# Patient Record
Sex: Female | Born: 1990 | Race: White | Hispanic: No | Marital: Married | Smoking: Never smoker
Health system: Southern US, Community
[De-identification: ages and names within clinical notes are randomized; demographics above are authoritative.]

## PROBLEM LIST (undated history)

## (undated) DIAGNOSIS — G43909 Migraine, unspecified, not intractable, without status migrainosus: Secondary | ICD-10-CM

## (undated) DIAGNOSIS — Z8659 Personal history of other mental and behavioral disorders: Secondary | ICD-10-CM

## (undated) DIAGNOSIS — Z8619 Personal history of other infectious and parasitic diseases: Secondary | ICD-10-CM

## (undated) DIAGNOSIS — D352 Benign neoplasm of pituitary gland: Secondary | ICD-10-CM

## (undated) DIAGNOSIS — R011 Cardiac murmur, unspecified: Secondary | ICD-10-CM

## (undated) HISTORY — DX: Personal history of other infectious and parasitic diseases: Z86.19

## (undated) HISTORY — DX: Personal history of other mental and behavioral disorders: Z86.59

## (undated) HISTORY — DX: Benign neoplasm of pituitary gland: D35.2

## (undated) HISTORY — DX: Migraine, unspecified, not intractable, without status migrainosus: G43.909

## (undated) HISTORY — DX: Cardiac murmur, unspecified: R01.1

---
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
## 2017-08-15 HISTORY — PX: KNEE ARTHROSCOPY: SUR90

## 2018-04-06 DIAGNOSIS — D352 Benign neoplasm of pituitary gland: Secondary | ICD-10-CM | POA: Insufficient documentation

## 2018-04-06 DIAGNOSIS — R011 Cardiac murmur, unspecified: Secondary | ICD-10-CM | POA: Insufficient documentation

## 2019-02-22 ENCOUNTER — Other Ambulatory Visit: Payer: Self-pay

## 2019-02-22 ENCOUNTER — Ambulatory Visit: Payer: Managed Care, Other (non HMO) | Admitting: Family Medicine

## 2019-02-22 ENCOUNTER — Encounter: Payer: Self-pay | Admitting: Family Medicine

## 2019-02-22 VITALS — BP 98/70 | HR 67 | Temp 98.7°F | Resp 14 | Ht 63.5 in | Wt 167.2 lb

## 2019-02-22 DIAGNOSIS — Z7689 Persons encountering health services in other specified circumstances: Secondary | ICD-10-CM

## 2019-02-22 DIAGNOSIS — D352 Benign neoplasm of pituitary gland: Secondary | ICD-10-CM

## 2019-02-22 DIAGNOSIS — E663 Overweight: Secondary | ICD-10-CM

## 2019-02-22 DIAGNOSIS — R079 Chest pain, unspecified: Secondary | ICD-10-CM | POA: Diagnosis not present

## 2019-02-22 LAB — COMPREHENSIVE METABOLIC PANEL
ALT: 32 U/L (ref 0–35)
AST: 22 U/L (ref 0–37)
Albumin: 4.3 g/dL (ref 3.5–5.2)
Alkaline Phosphatase: 70 U/L (ref 39–117)
BUN: 8 mg/dL (ref 6–23)
CO2: 27 mEq/L (ref 19–32)
Calcium: 8.8 mg/dL (ref 8.4–10.5)
Chloride: 105 mEq/L (ref 96–112)
Creatinine, Ser: 0.7 mg/dL (ref 0.40–1.20)
GFR: 100.33 mL/min (ref >60.00)
Glucose, Bld: 80 mg/dL (ref 70–99)
Potassium: 4.1 mEq/L (ref 3.5–5.1)
Sodium: 138 mEq/L (ref 135–145)
Total Bilirubin: 0.9 mg/dL (ref 0.2–1.2)
Total Protein: 7.3 g/dL (ref 6.0–8.3)

## 2019-02-22 LAB — HEMOGLOBIN A1C: Hgb A1c MFr Bld: 5.4 % (ref 4.6–6.5)

## 2019-02-22 LAB — CBC WITH DIFFERENTIAL/PLATELET
Basophils Absolute: 0 10*3/uL (ref 0.0–0.1)
Basophils Relative: 0.5 % (ref 0.0–3.0)
Eosinophils Absolute: 0 10*3/uL (ref 0.0–0.7)
Eosinophils Relative: 0.8 % (ref 0.0–5.0)
HCT: 39 % (ref 36.0–46.0)
Hemoglobin: 13.1 g/dL (ref 12.0–15.0)
Lymphocytes Relative: 32.6 % (ref 12.0–46.0)
Lymphs Abs: 1.6 10*3/uL (ref 0.7–4.0)
MCHC: 33.7 g/dL (ref 30.0–36.0)
MCV: 94.4 fl (ref 78.0–100.0)
Monocytes Absolute: 0.5 10*3/uL (ref 0.1–1.0)
Monocytes Relative: 9.4 % (ref 3.0–12.0)
Neutro Abs: 2.7 10*3/uL (ref 1.4–7.7)
Neutrophils Relative %: 56.7 % (ref 43.0–77.0)
Platelets: 195 10*3/uL (ref 150.0–400.0)
RBC: 4.13 Mil/uL (ref 3.87–5.11)
RDW: 12.8 % (ref 11.5–15.5)
WBC: 4.8 10*3/uL (ref 4.0–10.5)

## 2019-02-22 LAB — LIPID PANEL
Cholesterol: 154 mg/dL (ref 0–200)
HDL: 40.8 mg/dL (ref >39.00)
LDL Cholesterol: 101 mg/dL — ABNORMAL HIGH (ref 0–99)
NonHDL: 113.08
Total CHOL/HDL Ratio: 4
Triglycerides: 58 mg/dL (ref 0.0–149.0)
VLDL: 11.6 mg/dL (ref 0.0–40.0)

## 2019-02-22 NOTE — Progress Notes (Signed)
   Subjective:    Patient ID: Cynthia Spears, female    DOB: [birth year], 28 y.o.   MRN: [id number]  HPI This is a 28 yo female who presents today to establish care. She is married and recently moved to the area. Enjoys hiking, running, outdoor activities, crocheting. She works at the [employer]. Loves her job. Stress level is low.   Last CPE- Pap- within the last 3 years. Had Mirena placed 3 years ago.  Tdap- unsure Flu- annual Eye- this year Dental- regular Exercise- regular  Prolactinoma diagnosed in 2014. Saw endocrine, requests referral. Continues on cabergoline. Headaches frequently. Last MRI 2018 (approximately). Last endocrine visit 10/2018. Not due for labs until 04/2019.   Mirena- 2019, doing well.   Chest pain for several years. Is having at varying times of day. No known triggers. Has a log of 4 episodes in last 2 weeks. Had one severe episode. Pain is alternately sharp, achy, sometimes radiates to upper abdomen. Lasts from few minutes to all day. No nausea or vomiting, no palpitations, no diaphoresis, no lower extremity edema. Family history of heart disease- father, both grandfathers with MI.   Past Medical History:  Diagnosis Date  . Heart murmur   . History of anorexia nervosa   . History of chicken pox   . Migraine   . Prolactinoma [facility])    Past Surgical History:  Procedure Laterality Date  . APPENDECTOMY  2008  . KNEE ARTHROSCOPY Right 2019  . SHOULDER ARTHROSCOPY Right 2019   Family History  Problem Relation Age of Onset  . Hyperlipidemia Mother   . Irritable bowel syndrome Mother   . Hypothyroidism Mother   . Learning disabilities Mother   . Hypertension Father   . Hyperlipidemia Father   . Arthritis Father    Social History   Tobacco Use  . Smoking status: Never Smoker  . Smokeless tobacco: Never Used  Substance Use Topics  . Alcohol use: Yes    Comment: once every other month  . Drug use: Never      Review of Systems Per HPI     Objective:   Physical Exam Physical Exam  Vitals reviewed. Constitutional: Oriented to person, place, and time. Appears well-developed and well-nourished.  HENT:  Head: Normocephalic and atraumatic.  Eyes: Conjunctivae are normal.  Neck: Normal range of motion. Neck supple.  Cardiovascular: Normal rate.   Pulmonary/Chest: Effort normal.  Neurological: Alert and oriented to person, place, and time.  Psychiatric: Normal mood and affect. Behavior is normal. Judgment and thought content normal.     BP 98/70   Pulse 67   Temp 98.7 F (37.1 C)   Resp 14   Ht 5' 3.5" (1.613 m)   Wt 167 lb 4 oz (75.9 kg)   BMI 29.16 kg/m       Assessment & Plan:  1. Encounter to establish care - will request records from previous gyn/endocrinologist  2. Chest pain, unspecified type - intermittent in nature, does not sound like typical angina, advised her to go to UC or ER if persistent episode lasting more than 15 minutes - CBC with Differential - Comprehensive metabolic panel  3. Overweight (BMI 25.0-29.9) - Lipid Panel - Hemoglobin A1c  4. Prolactinoma [facility]) - Records requested from prior endocrinologist - Ambulatory referral to Endocrinology   Clarene Reamer, FNP-BC  [facility]  02/22/2019 10:23 AM

## 2019-02-22 NOTE — Patient Instructions (Signed)
Good to see you today  I have put in a referral to endocrine  I will send you a message about your labs  If you develop chest pain that lasts more than 15 minutes, call the office if it is during the week or go to Urgent Care or the ER

## 2019-02-24 ENCOUNTER — Encounter: Payer: Self-pay | Admitting: Family Medicine

## 2019-02-25 NOTE — Telephone Encounter (Signed)
Cynthia Spears, I will work on getting records from those offices.

## 2019-03-01 NOTE — Telephone Encounter (Signed)
Pt called back with her maiden name. It is Cynthia Spears.

## 2019-03-04 ENCOUNTER — Encounter: Payer: Self-pay | Admitting: Emergency Medicine

## 2019-03-04 ENCOUNTER — Other Ambulatory Visit: Payer: Self-pay

## 2019-03-04 ENCOUNTER — Emergency Department
Admission: EM | Admit: 2019-03-04 | Discharge: 2019-03-05 | Disposition: A | Discharge status: Home / Self Care | Payer: Managed Care, Other (non HMO) | Attending: Emergency Medicine | Admitting: Emergency Medicine

## 2019-03-04 DIAGNOSIS — R079 Chest pain, unspecified: Secondary | ICD-10-CM | POA: Diagnosis present

## 2019-03-04 DIAGNOSIS — Z79899 Other long term (current) drug therapy: Secondary | ICD-10-CM | POA: Diagnosis not present

## 2019-03-04 LAB — BASIC METABOLIC PANEL
Anion gap: 8 (ref 5–15)
BUN: 10 mg/dL (ref 6–20)
CO2: 25 mmol/L (ref 22–32)
Calcium: 8.7 mg/dL — ABNORMAL LOW (ref 8.9–10.3)
Chloride: 104 mmol/L (ref 98–111)
Creatinine, Ser: 0.6 mg/dL (ref 0.44–1.00)
GFR calc Af Amer: >60 mL/min (ref >60)
GFR calc non Af Amer: >60 mL/min (ref >60)
Glucose, Bld: 104 mg/dL — ABNORMAL HIGH (ref 70–99)
Potassium: 3.8 mmol/L (ref 3.5–5.1)
Sodium: 137 mmol/L (ref 135–145)

## 2019-03-04 LAB — CBC
HCT: 36.9 % (ref 36.0–46.0)
Hemoglobin: 12.4 g/dL (ref 12.0–15.0)
MCH: 30.9 pg (ref 26.0–34.0)
MCHC: 33.6 g/dL (ref 30.0–36.0)
MCV: 92 fL (ref 80.0–100.0)
Platelets: 179 10*3/uL (ref 150–400)
RBC: 4.01 MIL/uL (ref 3.87–5.11)
RDW: 11.8 % (ref 11.5–15.5)
WBC: 7.4 10*3/uL (ref 4.0–10.5)
nRBC: 0 % (ref 0.0–0.2)

## 2019-03-04 LAB — TROPONIN I (HIGH SENSITIVITY): Troponin I (High Sensitivity): <2 ng/L (ref <18)

## 2019-03-04 NOTE — ED Triage Notes (Signed)
Pt reports med chest pain that started 1-2 hours ago and has gotten worse. Pt states pain is intermittent sharp, stabbing but a constant dull. Denies NV or SOB.

## 2019-03-04 NOTE — ED Provider Notes (Signed)
[facility] Emergency Department Provider Note  ____________________________________________   First MD Initiated Contact with Patient 03/04/19 2318     (approximate)  I have reviewed the triage vital signs and the nursing notes.   HISTORY  Chief Complaint Chest Pain    HPI Cynthia Spears is a 28 y.o. female with no chronic medical issues except as listed below who presents for evaluation of episodic chest pain.  She reports it is been going on for more than a year.  Only recently did she get established with a primary care provider.  The primary care provider recommended that the next time she  has an episode she should come to an urgent care or the emergency department.  She had acute onset of chest pain that started about 5 hours prior to my assessment of the patient.  She said that it feels different at different times but mostly it is an intermittent, mild to moderate sharp pain in the center of her chest with a constant dull ache.  Usually it lasts a few hours and then goes away.  It feels better at this time.  Nothing in particular makes it better or worse and it starts for no apparent reason.  It is not accompanied with shortness of breath or nausea/vomiting.  She has not been in contact with COVID-19 patients.  She denies sore throat, cough, shortness of breath, nausea, vomiting, abdominal pain, and dysuria.  She has not been on any long trips recently, no surgeries, no history of cancer, no history of blood clots in the legs of the lungs.  No unilateral leg pain or leg swelling.  She has no cardiac history, does not smoke, no diabetes, no history of hypercholesterolemia.  She does have several first-degree family members who have had cardiac disease.  She is in no distress at this time.        Past Medical History:  Diagnosis Date  . Heart murmur   . History of anorexia nervosa   . History of chicken pox   . Migraine   . Prolactinoma [facility])      Patient Active Problem List   Diagnosis Date Noted  . Murmur 04/06/2018  . 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 APPENDECTOMY  2008  . KNEE ARTHROSCOPY Right 2019  . SHOULDER ARTHROSCOPY Right 2019    Prior to Admission medications   Medication Sig Start Date End Date Taking? Authorizing Provider  cabergoline (DOSTINEX) 0.5 MG tablet Take 1 tablet by mouth 2 (two) times a week. 11/09/18   [provider]  levonorgestrel (MIRENA) 20 MCG/24HR IUD 1 each by Intrauterine route once.    [provider]    Allergies Patient has no known allergies.  Family History  Problem Relation Age of Onset  . Hyperlipidemia Mother   . Irritable bowel syndrome Mother   . Hypothyroidism Mother   . Learning disabilities Mother   . Hypertension Father   . Hyperlipidemia Father   . Arthritis Father   . Other Father        LAD 50% blockage  . Healthy Sister   . Healthy Brother   . Arthritis Maternal Grandmother   . Stroke Maternal Grandmother   . Hypothyroidism Maternal Grandmother   . Alzheimer's disease Maternal Grandmother   . Diabetes Maternal Grandmother   . Alcohol abuse Maternal Grandfather   . Diabetes Maternal Grandfather   . Heart attack Maternal Grandfather   .  Heart disease Maternal Grandfather   . Hypertension Maternal Grandfather   . Hyperlipidemia Maternal Grandfather   . Arthritis Paternal Grandmother   . Diabetes Paternal Grandmother   . Hypertension Paternal Grandmother   . Hyperlipidemia Paternal Grandmother   . Atrial fibrillation Paternal Grandmother   . Heart attack Paternal Grandfather     Social History Social History   Tobacco Use  . Smoking status: Never Smoker  . Smokeless tobacco: Never Used  Substance Use Topics  . Alcohol use: Yes    Comment: once every other month  . Drug use: Never    Review of Systems Constitutional: No fever/chills Eyes: No visual changes. ENT: No sore  throat. Cardiovascular: Chest pain as listed above Respiratory: Denies shortness of breath. Gastrointestinal: No abdominal pain.  No nausea, no vomiting.  No diarrhea.  No constipation. Genitourinary: Negative for dysuria. Musculoskeletal: Negative for neck pain.  Negative for back pain. Integumentary: Negative for rash. Neurological: Negative for headaches, focal weakness or numbness.   ____________________________________________   PHYSICAL EXAM:  VITAL SIGNS: ED Triage Vitals  Enc Vitals Group     BP 03/04/19 1958 127/81     Pulse Rate 03/04/19 1958 60     Resp 03/04/19 1958 18     Temp 03/04/19 1958 98.6 F (37 C)     Temp Source 03/04/19 1958 Oral     SpO2 03/04/19 1958 98 %     Weight 03/04/19 1957 74.8 kg (165 lb)     Height 03/04/19 1957 1.6 m (5\' 3" )     Head Circumference --      Peak Flow --      Pain Score 03/04/19 1957 5     Pain Loc --      Pain Edu? --      Excl. in [place]? --     Constitutional: Alert and oriented. Well appearing and in no acute distress. Eyes: Conjunctivae are normal.  Head: Atraumatic. Nose: No congestion/rhinnorhea. Mouth/Throat: Mucous membranes are moist. Neck: No stridor.  No meningeal signs.   Cardiovascular: Normal rate, regular rhythm. Good peripheral circulation. Grossly normal heart sounds. Respiratory: Normal respiratory effort.  No retractions. No audible wheezing. Gastrointestinal: Soft and nontender. No distention.  Musculoskeletal: No lower extremity tenderness nor edema. No gross deformities of extremities. Neurologic:  Normal speech and language. No gross focal neurologic deficits are appreciated.  Skin:  Skin is warm, dry and intact. No rash noted. Psychiatric: Mood and affect are normal. Speech and behavior are normal.  ____________________________________________   LABS (all labs ordered are listed, but only abnormal results are displayed)  Labs Reviewed  BASIC METABOLIC PANEL - Abnormal; Notable for the  following components:      Result Value   Glucose, Bld 104 (*)    Calcium 8.7 (*)    All other components within normal limits  CBC  TROPONIN I (HIGH SENSITIVITY)  TROPONIN I (HIGH SENSITIVITY)   ____________________________________________  EKG  ED ECG REPORT I, Hinda Kehr, the attending physician, personally viewed and interpreted this ECG.  Date: 03/04/2019 EKG Time: 20:03 Rate: 58 Rhythm: borderline bradycardia QRS Axis: normal Intervals: normal ST/T Wave abnormalities: normal Narrative Interpretation: no evidence of acute ischemia  ____________________________________________  RADIOLOGY   ED MD interpretation: No indication for chest x-ray  Official radiology report(s): No results found.  ____________________________________________   PROCEDURES   Procedure(s) performed (including Critical Care):  Procedures   ____________________________________________   INITIAL IMPRESSION / MDM / ASSESSMENT AND PLAN / ED COURSE  As  part of my medical decision making, I reviewed the following data within the [place] notes reviewed and incorporated, Labs reviewed , EKG interpreted , Old chart reviewed and Notes from prior ED visits   Differential diagnosis includes, but is not limited to, anxiety/panic attack, musculoskeletal pain, pericarditis/myocarditis, ACS, PE, pneumonia, costochondritis.  The patient is well-appearing and in no distress with normal and stable vitals.  She is afebrile and has no infectious signs or symptoms.  She has been having symptoms for at least a year that have not been getting any worse.  She has a history of anxiety which I suspect is contributed.  She is in no distress at this time with a normal EKG.  She is PERC negative and has normal lab work including serial negative high-sensitivity troponins.  She is low risk for ACS based on HEAR and HEART scores.  I provided reassurance to the patient and we went over  her results.  Now that she is established with a primary care provider she has someone with whom she can follow-up and I also gave her the name and number of Dr. Clayborn Bigness with cardiology with whom she can follow-up for a telemedicine visit and possible additional outpatient work-up.  She understands and agrees with the plan and I gave my usual customary return precautions.          ____________________________________________  FINAL CLINICAL IMPRESSION(S) / ED DIAGNOSES  Final diagnoses:  Chest pain, unspecified type     MEDICATIONS GIVEN DURING THIS VISIT:  Medications - No data to display   ED Discharge Orders    None      *Please note:  Devery Odwyer was evaluated in Emergency Department on 03/05/2019 for the symptoms described in the history of present illness. She was evaluated in the context of the global COVID-19 pandemic, which necessitated consideration that the patient might be at risk for infection with the SARS-CoV-2 virus that causes COVID-19. Institutional protocols and algorithms that pertain to the evaluation of patients at risk for COVID-19 are in a state of rapid change based on information released by regulatory bodies including the CDC and federal and state organizations. These policies and algorithms were followed during the patient's care in the ED.  Some ED evaluations and interventions may be delayed as a result of limited staffing during the pandemic.*  Note:  This document was prepared using Dragon voice recognition software and may include unintentional dictation errors.   Hinda Kehr, MD 03/05/19 [phone]

## 2019-03-05 ENCOUNTER — Encounter: Payer: Self-pay | Admitting: Family Medicine

## 2019-03-05 LAB — TROPONIN I (HIGH SENSITIVITY): Troponin I (High Sensitivity): <2 ng/L (ref <18)

## 2019-03-05 NOTE — Discharge Instructions (Signed)

## 2019-04-12 ENCOUNTER — Ambulatory Visit: Payer: Managed Care, Other (non HMO) | Admitting: Internal Medicine

## 2019-04-12 ENCOUNTER — Encounter: Payer: Self-pay | Admitting: Internal Medicine

## 2019-04-12 ENCOUNTER — Other Ambulatory Visit: Payer: Self-pay

## 2019-04-12 VITALS — BP 114/62 | HR 50 | Temp 98.2°F | Ht 64.0 in | Wt 165.4 lb

## 2019-04-12 DIAGNOSIS — R51 Headache: Secondary | ICD-10-CM | POA: Diagnosis not present

## 2019-04-12 DIAGNOSIS — D352 Benign neoplasm of pituitary gland: Secondary | ICD-10-CM | POA: Diagnosis not present

## 2019-04-12 DIAGNOSIS — R519 Headache, unspecified: Secondary | ICD-10-CM

## 2019-04-12 LAB — COMPREHENSIVE METABOLIC PANEL
ALT: 22 U/L (ref 0–35)
AST: 17 U/L (ref 0–37)
Albumin: 4.2 g/dL (ref 3.5–5.2)
Alkaline Phosphatase: 66 U/L (ref 39–117)
BUN: 12 mg/dL (ref 6–23)
CO2: 28 mEq/L (ref 19–32)
Calcium: 9.2 mg/dL (ref 8.4–10.5)
Chloride: 103 mEq/L (ref 96–112)
Creatinine, Ser: 0.62 mg/dL (ref 0.40–1.20)
GFR: 115.3 mL/min (ref >60.00)
Glucose, Bld: 84 mg/dL (ref 70–99)
Potassium: 4.2 mEq/L (ref 3.5–5.1)
Sodium: 137 mEq/L (ref 135–145)
Total Bilirubin: 0.8 mg/dL (ref 0.2–1.2)
Total Protein: 7.2 g/dL (ref 6.0–8.3)

## 2019-04-12 LAB — TSH: TSH: 2.45 u[IU]/mL (ref 0.35–4.50)

## 2019-04-12 LAB — CORTISOL: Cortisol, Plasma: 4.1 ug/dL

## 2019-04-12 LAB — T4, FREE: Free T4: 0.86 ng/dL (ref 0.60–1.60)

## 2019-04-12 NOTE — Progress Notes (Signed)
Name: Cynthia Spears  MRN/ DOB: [id number], 1991-03-09    Age/ Sex: 28 y.o., female    PCP: Elby Beck, FNP   Reason for Endocrinology Evaluation: Prolactinoma      Date of Initial Endocrinology Evaluation: 04/15/2019     HPI: Cynthia Spears is a 28 y.o. female with a past medical history of Pituitary microadenoma and heart murmur . The patient presented for initial endocrinology clinic visit on 04/15/2019 for consultative assistance with her Proloactinoma   Pt was diagnosed with prolactinoma  Secondary to microadenoma in 2014. Initial prolactin 200  She initially presented with headaches and irregular cycles followed by galactorrhea.   Has chronic headaches but no vision changes.  The headaches occipital and crown, relieved by staying in a quite place, and darkness. Occurs on average twice a week, lasting for days.   Up to date on eye exam.    Cabergoline 0.5 mg Twice a week    Last MRI was 2018   Denies nausea, and constipation   Married, plans on conceiving at the  end of 2021.   IUD in place   FH of thyroid disease (mother- hypothyroidism )   HISTORY:  Past Medical History:  Past Medical History:  Diagnosis Date   Heart murmur    History of anorexia nervosa    History of chicken pox    Migraine    Prolactinoma ([place])    Past Surgical History:  Past Surgical History:  Procedure Laterality Date   APPENDECTOMY  2008   KNEE ARTHROSCOPY Right 2019   SHOULDER ARTHROSCOPY Right 2019      Social History:  reports that she has never smoked. She has never used smokeless tobacco. She reports current alcohol use. She reports that she does not use drugs.  Family History: family history includes Alcohol abuse in her maternal grandfather; Alzheimer's disease in her maternal grandmother; Arthritis in her father, maternal grandmother, and paternal grandmother; Atrial fibrillation in her paternal grandmother; Diabetes in her maternal grandfather,  maternal grandmother, and paternal grandmother; Healthy in her brother and sister; Heart attack in her maternal grandfather and paternal grandfather; Heart disease in her maternal grandfather; Hyperlipidemia in her father, maternal grandfather, mother, and paternal grandmother; Hypertension in her father, maternal grandfather, and paternal grandmother; Hypothyroidism in her maternal grandmother and mother; Irritable bowel syndrome in her mother; Learning disabilities in her mother; Other in her father; Stroke in her maternal grandmother.   HOME MEDICATIONS: Allergies as of 04/12/2019   No Known Allergies     Medication List       Accurate as of April 12, 2019 11:59 PM. If you have any questions, ask your nurse or doctor.        cabergoline 0.5 MG tablet Commonly known as: DOSTINEX Take 1 tablet by mouth 2 (two) times a week.   levonorgestrel 20 MCG/24HR IUD Commonly known as: MIRENA 1 each by Intrauterine route once.         REVIEW OF SYSTEMS: A comprehensive ROS was conducted with the patient and is negative except as per HPI and below:  Review of Systems  Eyes: Negative for blurred vision and pain.  Respiratory: Negative for cough and shortness of breath.   Cardiovascular: Negative for chest pain and palpitations.  Gastrointestinal: Negative for nausea and vomiting.  Genitourinary: Negative for frequency.  Neurological: Positive for headaches. Negative for tingling.  Endo/Heme/Allergies: Negative for polydipsia.       OBJECTIVE:  VS: BP 114/62 (BP Location: Left Arm,  Patient Position: Sitting, Cuff Size: Normal)    Pulse (!) 50    Temp 98.2 F (36.8 C)    Ht 5\' 4"  (1.626 m)    Wt 165 lb 6.4 oz (75 kg)    SpO2 99%    BMI 28.39 kg/m    Wt Readings from Last 3 Encounters:  04/12/19 165 lb 6.4 oz (75 kg)  03/04/19 165 lb (74.8 kg)  02/22/19 167 lb 4 oz (75.9 kg)     EXAM: General: Pt appears well and is in NAD  Hydration: Well-hydrated with moist mucous membranes  and good skin turgor  Eyes: External eye exam normal without stare, lid lag or exophthalmos.  EOM intact.  PERRL.  Ears, Nose, Throat: Hearing: Grossly intact bilaterally Dental: Good dentition  Throat: Clear without mass, erythema or exudate  Neck: General: Supple without adenopathy. Thyroid: Thyroid size normal.  No goiter or nodules appreciated. No thyroid bruit.  Lungs: Clear with good BS bilat with no rales, rhonchi, or wheezes  Heart: Auscultation: RRR.  Abdomen: Normoactive bowel sounds, soft, nontender, without masses or organomegaly palpable  Extremities: Gait and station: Normal gait  Digits and nails: No clubbing, cyanosis, petechiae, or nodes Head and neck: Normal alignment and mobility BL UE: Normal ROM and strength. BL LE: No pretibial edema normal ROM and strength.  Skin: Hair: Texture and amount normal with gender appropriate distribution Skin Inspection: No rashes, acanthosis nigricans/skin tags. No lipohypertrophy Skin Palpation: Skin temperature, texture, and thickness normal to palpation  Neuro: Cranial nerves: II - XII grossly intact  Cerebellar: Normal coordination and movement; no tremor Motor: Normal strength throughout DTRs: 2+ and symmetric in UE without delay in relaxation phase  Mental Status: Judgment, insight: Intact Orientation: Oriented to time, place, and person Memory: Intact for recent and remote events Mood and affect: No depression, anxiety, or agitation     DATA REVIEWED:   Results for BEKI, GREASON (MRN [id number]) as of 04/15/2019 10:14  Ref. Range 04/12/2019 14:01  Sodium Latest Ref Range: 135 - 145 mEq/L 137  Potassium Latest Ref Range: 3.5 - 5.1 mEq/L 4.2  Chloride Latest Ref Range: 96 - 112 mEq/L 103  CO2 Latest Ref Range: 19 - 32 mEq/L 28  Glucose Latest Ref Range: 70 - 99 mg/dL 84  BUN Latest Ref Range: 6 - 23 mg/dL 12  Creatinine Latest Ref Range: 0.40 - 1.20 mg/dL 0.62  Calcium Latest Ref Range: 8.4 - 10.5 mg/dL 9.2  Alkaline  Phosphatase Latest Ref Range: 39 - 117 U/L 66  Albumin Latest Ref Range: 3.5 - 5.2 g/dL 4.2  AST Latest Ref Range: 0 - 37 U/L 17  ALT Latest Ref Range: 0 - 35 U/L 22  Total Protein Latest Ref Range: 6.0 - 8.3 g/dL 7.2  Total Bilirubin Latest Ref Range: 0.2 - 1.2 mg/dL 0.8  GFR Latest Ref Range: >60.00 mL/min 115.30  Cortisol, Plasma Latest Units: ug/dL 4.1  Prolactin Latest Units: ng/mL 2.7  TSH Latest Ref Range: 0.35 - 4.50 uIU/mL 2.45  T4,Free(Direct) Latest Ref Range: 0.60 - 1.60 ng/dL 0.86   Old records , labs and images have been reviewed.   ASSESSMENT/PLAN/RECOMMENDATIONS:   1. Pituitary Microadenoma    - This is per pt report of a 4 mm pituitary adenoma - Unclear if prior work up has been done to exclude hypersecretion - Will repeat MRI     2. Prolactinemia  - No clinical symptom of hyperprolactinemia - Tolerating cabergoline  - Discussed pregnancy has to be planned  and we need to discuss in details as this nears - Pt advised to stop cabergoline with positive pregnancy test  - Discussed the importance of having up to date visual field testing    Medications : Continue cabergoline 0.5 mg twice weekly    3. Headaches :   - The amount of headaches and the location is inconsistent with pituitary adenoma, I have advised her to discuss these headaches with her PCP and consider neurology evaluation.   F/u 3 months      Signed electronically by: Mack Guise, MD  [facility] Endocrinology  [facility] [address] [place], [place] [address] Phone: [phone] FAX: [phone]   CC: Elby Beck, [place] [address] Phone: [phone] Fax: [phone]   Return to Endocrinology clinic as below: Future Appointments  Date Time Provider [place]  07/05/2019  2:00 PM Elaiza Shoberg, Melanie Crazier, MD [facility] None

## 2019-04-12 NOTE — Patient Instructions (Signed)
-   Stop by the lab today, will contact with the results - Will schedule you for a pituitary MRI , if you do not hear about it in 3 weeks , please contact us

## 2019-04-15 DIAGNOSIS — R519 Headache, unspecified: Secondary | ICD-10-CM | POA: Insufficient documentation

## 2019-04-15 DIAGNOSIS — G8929 Other chronic pain: Secondary | ICD-10-CM | POA: Insufficient documentation

## 2019-04-15 LAB — PROLACTIN: Prolactin: 2.7 ng/mL

## 2019-04-15 LAB — ACTH: C206 ACTH: 11 pg/mL (ref 6–50)

## 2019-05-06 ENCOUNTER — Telehealth: Payer: Self-pay

## 2019-05-06 NOTE — Telephone Encounter (Signed)
[employer] faxed approval for MRI authorization # [id number] effective 05/02/2019-10/29/2019

## 2019-05-29 ENCOUNTER — Telehealth: Payer: Self-pay

## 2019-05-29 NOTE — Telephone Encounter (Signed)
Patient called in wanting to push her MRI out till next year because of finical issues. Also wants to know if she needs to cancel her November appt because of wanting to push her MRI out.

## 2019-06-07 NOTE — Telephone Encounter (Signed)
FYI

## 2019-06-15 ENCOUNTER — Other Ambulatory Visit: Payer: Managed Care, Other (non HMO)

## 2019-06-16 ENCOUNTER — Encounter: Payer: Self-pay | Admitting: Internal Medicine

## 2019-06-21 NOTE — Telephone Encounter (Signed)
Left VM to make patient aware she still needs keep 07/05/19 appointment

## 2019-06-24 ENCOUNTER — Other Ambulatory Visit: Payer: Self-pay | Admitting: Family Medicine

## 2019-06-24 ENCOUNTER — Telehealth: Payer: Self-pay | Admitting: Family Medicine

## 2019-06-24 DIAGNOSIS — G8929 Other chronic pain: Secondary | ICD-10-CM

## 2019-06-24 DIAGNOSIS — D352 Benign neoplasm of pituitary gland: Secondary | ICD-10-CM

## 2019-06-24 NOTE — Telephone Encounter (Signed)
Please call patient and let her know that I have placed referral to neurology.

## 2019-06-24 NOTE — Telephone Encounter (Signed)
Pt aware. Nothing further needed 

## 2019-06-24 NOTE — Telephone Encounter (Signed)
Patient stated that she went to see her Endocrinologist.  She advised the patient to reach out to her PCP She believes that the patient needs a referral sent over to neurology so she could be seen by them   Patient would like to know if you could send the referral

## 2019-07-05 ENCOUNTER — Ambulatory Visit: Payer: Managed Care, Other (non HMO) | Admitting: Internal Medicine

## 2019-07-05 ENCOUNTER — Other Ambulatory Visit: Payer: Self-pay

## 2019-07-05 ENCOUNTER — Encounter: Payer: Self-pay | Admitting: Internal Medicine

## 2019-07-05 VITALS — BP 96/54 | HR 84 | Ht 64.0 in | Wt 166.4 lb

## 2019-07-05 DIAGNOSIS — D352 Benign neoplasm of pituitary gland: Secondary | ICD-10-CM

## 2019-07-05 NOTE — Patient Instructions (Signed)
-   Continue cabergoline 0.5 mg twice weekly

## 2019-07-05 NOTE — Progress Notes (Signed)
Name: Cynthia Spears  MRN/ DOB: [id number], 1991-05-19    Age/ Sex: 28 y.o., female     PCP: Elby Beck, FNP   Reason for Endocrinology Evaluation: Prolactinoma     Initial Endocrinology Clinic Visit: 04/15/2019    PATIENT IDENTIFIER: Cynthia Spears is a 28 y.o., female with a past medical history of Pituitary microadenoma. She has followed with [facility] since 04/15/19 for consultative assistance with management of her Prolactinoma.   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 Initial prolactin 200 She initially presented with headaches and irregular cycles followed by galactorrhea  FH of thyroid disease (mother- hypothyroidism )   SUBJECTIVE:   During last visit (04/15/2019 ): Continued on cabergoline   Today (07/05/2019):  Cynthia Spears is here for a 3 month follow up on prolactinoma. She has chronic headaches but no vision changes. She has an upcoming appointment with neurology.  She denies any GI side effects   Up to date on eye exam spring 2020   Cabergoline 0.5 mg Twice a week   Married, plans on conceiving at the  end of 2021.   IUD in place     ROS:  As per HPI.   HISTORY:  Past Medical History:  Past Medical History:  Diagnosis Date  . Heart murmur   . History of anorexia nervosa   . History of chicken pox   . Migraine   . Prolactinoma [name])    Past Surgical History:  Past Surgical History:  Procedure Laterality Date  . APPENDECTOMY  2008  . KNEE ARTHROSCOPY Right 2019  . SHOULDER ARTHROSCOPY Right 2019    Social History:  reports that she has never smoked. She has never used smokeless tobacco. She reports current alcohol use. She reports that she does not use drugs. Family History:  Family History  Problem Relation Age of Onset  . Hyperlipidemia Mother   . Irritable bowel syndrome Mother   . Hypothyroidism Mother   . Learning disabilities Mother    . Hypertension Father   . Hyperlipidemia Father   . Arthritis Father   . Other Father        LAD 50% blockage  . Healthy Sister   . Healthy Brother   . Arthritis Maternal Grandmother   . Stroke Maternal Grandmother   . Hypothyroidism Maternal Grandmother   . Alzheimer's disease Maternal Grandmother   . Diabetes Maternal Grandmother   . Alcohol abuse Maternal Grandfather   . Diabetes Maternal Grandfather   . Heart attack Maternal Grandfather   . Heart disease Maternal Grandfather   . Hypertension Maternal Grandfather   . Hyperlipidemia Maternal Grandfather   . Arthritis Paternal Grandmother   . Diabetes Paternal Grandmother   . Hypertension Paternal Grandmother   . Hyperlipidemia Paternal Grandmother   . Atrial fibrillation Paternal Grandmother   . Heart attack Paternal Grandfather      HOME MEDICATIONS: Allergies as of 07/05/2019   No Known Allergies     Medication List       Accurate as of July 05, 2019  1:24 PM. If you have any questions, ask your nurse or doctor.        cabergoline 0.5 MG tablet Commonly known as: DOSTINEX Take 1 tablet by mouth 2 (two) times a week.   levonorgestrel 20 MCG/24HR IUD Commonly known as: MIRENA 1 each by Intrauterine route once.         OBJECTIVE:   PHYSICAL EXAM: VS:  BP (!) 96/54   Pulse 84   Ht 5\' 4"  (1.626 m)   Wt 166 lb 6.4 oz (75.5 kg)   SpO2 99%   BMI 28.56 kg/m    EXAM: General: Pt appears well and is in NAD  Lungs: Clear with good BS bilat with no rales, rhonchi, or wheezes  Heart: Auscultation: RRR. + systolic murmur ? aortic  Abdomen: Normoactive bowel sounds, soft, nontender, without masses or organomegaly palpable  Extremities:  BL LE: No pretibial edema normal ROM and strength.  Mental Status: Judgment, insight: Intact Orientation: Oriented to time, place, and person Mood and affect: No depression, anxiety, or agitation     DATA REVIEWED: Results for TAELA, SATCHWELL (MRN [id number]) as  of 07/08/2019 09:04  Ref. Range 04/12/2019 14:01 07/05/2019 13:29  Prolactin Latest Units: ng/mL 2.7 2.7   Results for OMEKA, REICHERT (MRN [id number]) as of 07/05/2019 13:06  Ref. Range 04/12/2019 14:01  Cortisol, Plasma Latest Units: ug/dL 4.1  Prolactin Latest Units: ng/mL 2.7  Glucose Latest Ref Range: 70 - 99 mg/dL 84  TSH Latest Ref Range: 0.35 - 4.50 uIU/mL 2.45  T4,Free(Direct) Latest Ref Range: 0.60 - 1.60 ng/dL 0.86     ASSESSMENT / PLAN / RECOMMENDATIONS:   1. Pituitary Microadenoma    - This is per pt report of a 4 mm pituitary adenoma - Cortisol, ACTH and TFT's are normal.  - She was planned to have an MRI in 2020 but wanted to postpone due to lack of funds at this time.  She is up to date on visual field testing     2. Prolactinemia  - No clinical symptom of hyperprolactinemia - Tolerating cabergoline - Pt advised to stop cabergoline with positive pregnancy test     Medications : Continue cabergoline 0.5 mg twice weekly    F/U in 6 months   This visit occurred during the SARS-CoV-2 public health emergency.  Safety protocols were in place, including screening questions prior to the visit, additional usage of staff PPE, and extensive cleaning of exam room while observing appropriate contact time as indicated for disinfecting solutions.     Signed electronically by: Mack Guise, MD  [facility]  [facility] [address], [place], [place] [address] Phone: [phone] FAX: [phone]      CC: Elby Beck, [place] [address] Phone: [phone]  Fax: [phone]   Return to Endocrinology clinic as below: Future Appointments  Date Time Provider [place]  07/05/2019  2:00 PM Shamleffer, Melanie Crazier, MD [facility] None  07/31/2019  9:30 AM Melvenia Beam, MD [facility] None

## 2019-07-06 LAB — PROLACTIN: Prolactin: 2.7 ng/mL

## 2019-07-31 ENCOUNTER — Encounter: Payer: Self-pay | Admitting: *Deleted

## 2019-07-31 ENCOUNTER — Telehealth (INDEPENDENT_AMBULATORY_CARE_PROVIDER_SITE_OTHER): Payer: Managed Care, Other (non HMO) | Admitting: Neurology

## 2019-07-31 ENCOUNTER — Telehealth: Payer: Self-pay | Admitting: *Deleted

## 2019-07-31 DIAGNOSIS — G43709 Chronic migraine without aura, not intractable, without status migrainosus: Secondary | ICD-10-CM | POA: Diagnosis not present

## 2019-07-31 NOTE — Progress Notes (Signed)
[facility]    Provider:  Dr Jaynee Eagles Requesting Provider: Elby Beck, FNP Primary Care Provider:  Elby Beck, FNP  CC:  Migraines  Virtual Visit via Video Note  I connected with@ on 08/01/19 at  9:30 AM EST by a video enabled telemedicine application and verified that I am speaking with the correct person using two identifiers. Patient is at home and physician is in the office.   I discussed the limitations of evaluation and management by telemedicine and the availability of in person appointments. The patient expressed understanding and agreed to proceed.  Melvenia Beam, MD  HPI:  Cynthia Spears is a 28 y.o. female here as requested by Elby Beck, FNP for  for migraines. They started in 2014 and she was diagnosed with prolactinoma in 2014. More on the right but it can be bilateral in the occipitoparietal areas. Dad had migraines. She gets nausea, laying the dark quiet room helps, she can wake up with the headaches or anytime, light and sound sensitivity. They can be moerately severe to severe. Can last 24-72 hours. She has 25 headache days a month and 10 migraine days a month. Ongoing for years. Father has migraines. No aura. No medication overuse. No other focal neurologic deficits, associated symptoms, inciting events or modifiable factors.  Reviewed notes, labs and imaging from outside physicians, which showed:  CMP normal 04/12/2019  Review of Systems: Patient complains of symptoms per HPI as well as the following symptoms: headaches. Pertinent negatives and positives per HPI. All others negative.   Social History   Socioeconomic History  . Marital status: Married    Spouse name: Not on file  . Number of children: Not on file  . Years of education: Not on file  . Highest education level: Not on file  Occupational History  . Not on file  Tobacco Use  . Smoking status: Never Smoker  . Smokeless tobacco: Never Used  Substance  and Sexual Activity  . Alcohol use: Yes    Comment: once every other month  . Drug use: Never  . Sexual activity: Yes    Birth control/protection: I.U.D.  Other Topics Concern  . Not on file  Social History Narrative  . Not on file   Social Determinants of Health   Financial Resource Strain:   . Difficulty of Paying Living Expenses: Not on file  Food Insecurity:   . Worried About Charity fundraiser in the Last Year: Not on file  . Ran Out of Food in the Last Year: Not on file  Transportation Needs:   . Lack of Transportation (Medical): Not on file  . Lack of Transportation (Non-Medical): Not on file  Physical Activity:   . Days of Exercise per Week: Not on file  . Minutes of Exercise per Session: Not on file  Stress:   . Feeling of Stress : Not on file  Social Connections:   . Frequency of Communication with Friends and Family: Not on file  . Frequency of Social Gatherings with Friends and Family: Not on file  . Attends Religious Services: Not on file  . Active Member of Clubs or Organizations: Not on file  . Attends Archivist Meetings: Not on file  . Marital Status: Not on file  Intimate Partner Violence:   . Fear of Current or Ex-Partner: Not on file  . Emotionally Abused: Not on file  . Physically Abused: Not on file  . Sexually Abused: Not on  file    Family History  Problem Relation Age of Onset  . Hyperlipidemia Mother   . Irritable bowel syndrome Mother   . Hypothyroidism Mother   . Learning disabilities Mother   . Hypertension Father   . Hyperlipidemia Father   . Arthritis Father   . Other Father        LAD 50% blockage  . Healthy Sister   . Healthy Brother   . Arthritis Maternal Grandmother   . Stroke Maternal Grandmother   . Hypothyroidism Maternal Grandmother   . Alzheimer's disease Maternal Grandmother   . Diabetes Maternal Grandmother   . Alcohol abuse Maternal Grandfather   . Diabetes Maternal Grandfather   . Heart attack Maternal  Grandfather   . Heart disease Maternal Grandfather   . Hypertension Maternal Grandfather   . Hyperlipidemia Maternal Grandfather   . Arthritis Paternal Grandmother   . Diabetes Paternal Grandmother   . Hypertension Paternal Grandmother   . Hyperlipidemia Paternal Grandmother   . Atrial fibrillation Paternal Grandmother   . Heart attack Paternal Grandfather     Past Medical History:  Diagnosis Date  . Heart murmur   . History of anorexia nervosa   . History of chicken pox   . Migraine   . Prolactinoma [facility])     Patient Active Problem List   Diagnosis Date Noted  . Chronic migraine without aura without status migrainosus, not intractable 08/01/2019  . Chronic nonintractable headache 04/15/2019  . Pituitary microadenoma ([place]) 04/12/2019  . Prolactinoma ([place]) 04/12/2019  . Murmur 04/06/2018  . Microprolactinoma ([place]) 04/06/2018    Past Surgical History:  Procedure Laterality Date  . APPENDECTOMY  2008  . KNEE ARTHROSCOPY Right 2019  . SHOULDER ARTHROSCOPY Right 2019    Current Outpatient Medications  Medication Sig Dispense Refill  . cabergoline (DOSTINEX) 0.5 MG tablet Take 1 tablet by mouth 2 (two) times a week.    Marland Kitchen levonorgestrel (MIRENA) 20 MCG/24HR IUD 1 each by Intrauterine route once.    . rizatriptan (MAXALT-MLT) 10 MG disintegrating tablet Take 1 tablet (10 mg total) by mouth as needed for migraine. May repeat in 2 hours if needed. Maximum 10 tablets a month. This is a 30-day supply. 30 tablet 3  . topiramate (TOPAMAX) 100 MG tablet Start with 1/2 tablet for one week then increase to whole tablet at bedtime 90 tablet 3   No current facility-administered medications for this visit.    Allergies as of 07/31/2019  . (No Known Allergies)    Vitals: There were no vitals taken for this visit. Last Weight:  Wt Readings from Last 1 Encounters:  07/05/19 166 lb 6.4 oz (75.5 kg)   Last Height:   Ht Readings from Last 1 Encounters:  07/05/19 5\' 4"  (1.626 m)      Physical exam: Exam:    Physical exam: Exam: Gen: NAD, conversant      CV: attempted, Could not perform over Web Video. Denies palpitations or chest pain or SOB. VS: Breathing at a normal rate. Weight appears within normal limits. Not febrile. Eyes: Conjunctivae clear without exudates or hemorrhage  Neuro: Detailed Neurologic Exam  Speech:    Speech is normal; fluent and spontaneous with normal comprehension.  Cognition:    The patient is oriented to person, place, and time;     recent and remote memory intact;     language fluent;     normal attention, concentration,     fund of knowledge Cranial Nerves:    The pupils  are equal, round, and reactive to light. Attempted, Cannot perform fundoscopic exam. Visual fields are full to finger confrontation. Extraocular movements are intact.  The face is symmetric with normal sensation. The palate elevates in the midline. Hearing intact. Voice is normal. Shoulder shrug is normal. The tongue has normal motion without fasciculations.   Coordination:    Normal finger to nose  Gait:    Normal native gait  Motor Observation:   no involuntary movements noted. Tone:    Appears normal  Posture:    Posture is normal. normal erect    Strength:    Strength is anti-gravity and symmetric in the upper and lower limbs.      Sensation: intact to LT     Reflex Exam:  DTR's:    Attempted, Could not perform over Web Video   Toes: Attempted Could not perform over Web Video  Clonus:   Attempted, Could not perform over Web Video     Assessment/Plan:  Patient with migraines. We had a long discussion about the options for acute and preventative management. We will also see her in the office in 8 weeks.   Start Topiramate for preventative Start Maxalt for acute F/u 8 weeks  Discussed: To prevent or relieve headaches, try the following: Cool Compress. Lie down and place a cool compress on your head.  Avoid headache triggers. If  certain foods or odors seem to have triggered your migraines in the past, avoid them. A headache diary might help you identify triggers.  Include physical activity in your daily routine. Try a daily walk or other moderate aerobic exercise.  Manage stress. Find healthy ways to cope with the stressors, such as delegating tasks on your to-do list.  Practice relaxation techniques. Try deep breathing, yoga, massage and visualization.  Eat regularly. Eating regularly scheduled meals and maintaining a healthy diet might help prevent headaches. Also, drink plenty of fluids.  Follow a regular sleep schedule. Sleep deprivation might contribute to headaches Consider biofeedback. With this mind-body technique, you learn to control certain bodily functions -- such as muscle tension, heart rate and blood pressure -- to prevent headaches or reduce headache pain.    Proceed to emergency room if you experience new or worsening symptoms or symptoms do not resolve, if you have new neurologic symptoms or if headache is severe, or for any concerning symptom.   Provided education and documentation from American headache Society toolbox including articles on: chronic migraine medication overuse headache, chronic migraines, prevention of migraines, behavioral and other nonpharmacologic treatments for headache.   Meds ordered this encounter  Medications  . topiramate (TOPAMAX) 100 MG tablet    Sig: Start with 1/2 tablet for one week then increase to whole tablet at bedtime    Dispense:  90 tablet    Refill:  3  . rizatriptan (MAXALT-MLT) 10 MG disintegrating tablet    Sig: Take 1 tablet (10 mg total) by mouth as needed for migraine. May repeat in 2 hours if needed. Maximum 10 tablets a month. This is a 30-day supply.    Dispense:  30 tablet    Refill:  3    Follow Up Instructions:    I discussed the assessment and treatment plan with the patient. The patient was provided an opportunity to ask questions and all  were answered. The patient agreed with the plan and demonstrated an understanding of the instructions.   The patient was advised to call back or seek an in-person evaluation if the symptoms worsen  or if the condition fails to improve as anticipated.  A total of 13minutes was spent Video face-to-face(not in person) with this patient. Over half this time was spent on counseling patient on the  1. Chronic migraine without aura without status migrainosus, not intractable    diagnosis and different diagnostic and therapeutic options, counseling and coordination of care, risks ans benefits of management, compliance, or risk factor reduction and education.     Cc: Elby Beck, FNP  Sarina Ill, MD  [facility] [address] [place], [place] [address]  Phone [phone] Fax [phone]

## 2019-07-31 NOTE — Telephone Encounter (Signed)
Called pt to update chart before VV this morning. LVM w/ office # asking for call back if able.

## 2019-08-01 ENCOUNTER — Encounter: Payer: Self-pay | Admitting: Neurology

## 2019-08-01 ENCOUNTER — Telehealth: Payer: Self-pay | Admitting: Neurology

## 2019-08-01 DIAGNOSIS — G43709 Chronic migraine without aura, not intractable, without status migrainosus: Secondary | ICD-10-CM | POA: Insufficient documentation

## 2019-08-01 MED ORDER — RIZATRIPTAN BENZOATE 10 MG PO TBDP: 10.0000 mg | ORAL_TABLET | ORAL | 3 refills | Status: DC | PRN

## 2019-08-01 MED ORDER — TOPIRAMATE 100 MG PO TABS: ORAL_TABLET | ORAL | 3 refills | Status: DC

## 2019-08-01 NOTE — Telephone Encounter (Signed)
Cynthia Spears would you please call and schedule patient for an 8-12 week follow up with me in the office please. Also let her know I sent in her scripts but the only pharmacy we had was her mail-in so hope that is ok she may have to wait for them to mail them. If she likes I can send to a local pharmacy. Thanks Civil engineer, contracting)

## 2019-08-01 NOTE — Patient Instructions (Signed)
Topiramate tablets What is this medicine? TOPIRAMATE (toe PYRE a mate) is used to treat seizures in adults or children with epilepsy. It is also used for the prevention of migraine headaches. This medicine may be used for other purposes; ask your health care provider or pharmacist if you have questions. COMMON BRAND NAME(S): Topamax, Topiragen What should I tell my health care provider before I take this medicine? They need to know if you have any of these conditions:  bleeding disorders  cirrhosis of the liver or liver disease  diarrhea  glaucoma  kidney stones or kidney disease  low blood counts, like low white cell, platelet, or red cell counts  lung disease like asthma, obstructive pulmonary disease, emphysema  metabolic acidosis  on a ketogenic diet  schedule for surgery or a procedure  suicidal thoughts, plans, or attempt; a previous suicide attempt by you or a family member  an unusual or allergic reaction to topiramate, other medicines, foods, dyes, or preservatives  pregnant or trying to get pregnant  breast-feeding How should I use this medicine? Take this medicine by mouth with a glass of water. Follow the directions on the prescription label. Do not crush or chew. You may take this medicine with meals. Take your medicine at regular intervals. Do not take it more often than directed. Talk to your pediatrician regarding the use of this medicine in children. Special care may be needed. While this drug may be prescribed for children as young as 2 years of age for selected conditions, precautions do apply. Overdosage: If you think you have taken too much of this medicine contact a poison control center or emergency room at once. NOTE: This medicine is only for you. Do not share this medicine with others. What if I miss a dose? If you miss a dose, take it as soon as you can. If your next dose is to be taken in less than 6 hours, then do not take the missed dose. Take  the next dose at your regular time. Do not take double or extra doses. What may interact with this medicine? Do not take this medicine with any of the following medications:  probenecid This medicine may also interact with the following medications:  acetazolamide  alcohol  amitriptyline  aspirin and aspirin-like medicines  birth control pills  certain medicines for depression  certain medicines for seizures  certain medicines that treat or prevent blood clots like warfarin, enoxaparin, dalteparin, apixaban, dabigatran, and rivaroxaban  digoxin  hydrochlorothiazide  lithium  medicines for pain, sleep, or muscle relaxation  metformin  methazolamide  NSAIDS, medicines for pain and inflammation, like ibuprofen or naproxen  pioglitazone  risperidone This list may not describe all possible interactions. Give your health care provider a list of all the medicines, herbs, non-prescription drugs, or dietary supplements you use. Also tell them if you smoke, drink alcohol, or use illegal drugs. Some items may interact with your medicine. What should I watch for while using this medicine? Visit your doctor or health care professional for regular checks on your progress. Do not stop taking this medicine suddenly. This increases the risk of seizures if you are using this medicine to control epilepsy. Wear a medical identification bracelet or chain to say you have epilepsy or seizures, and carry a card that lists all your medicines. This medicine can decrease sweating and increase your body temperature. Watch for signs of deceased sweating or fever, especially in children. Avoid extreme heat, hot baths, and saunas. Be careful   about exercising, especially in hot weather. Contact your health care provider right away if you notice a fever or decrease in sweating. You should drink plenty of fluids while taking this medicine. If you have had kidney stones in the past, this will help to reduce  your chances of forming kidney stones. If you have stomach pain, with nausea or vomiting and yellowing of your eyes or skin, call your doctor immediately. You may get drowsy, dizzy, or have blurred vision. Do not drive, use machinery, or do anything that needs mental alertness until you know how this medicine affects you. To reduce dizziness, do not sit or stand up quickly, especially if you are an older patient. Alcohol can increase drowsiness and dizziness. Avoid alcoholic drinks. If you notice blurred vision, eye pain, or other eye problems, seek medical attention at once for an eye exam. The use of this medicine may increase the chance of suicidal thoughts or actions. Pay special attention to how you are responding while on this medicine. Any worsening of mood, or thoughts of suicide or dying should be reported to your health care professional right away. This medicine may increase the chance of developing metabolic acidosis. If left untreated, this can cause kidney stones, bone disease, or slowed growth in children. Symptoms include breathing fast, fatigue, loss of appetite, irregular heartbeat, or loss of consciousness. Call your doctor immediately if you experience any of these side effects. Also, tell your doctor about any surgery you plan on having while taking this medicine since this may increase your risk for metabolic acidosis. Birth control pills may not work properly while you are taking this medicine. Talk to your doctor about using an extra method of birth control. Women who become pregnant while using this medicine may enroll in the [place] Antiepileptic Drug Pregnancy Registry by calling 1-888-233-2334. This registry collects information about the safety of antiepileptic drug use during pregnancy. What side effects may I notice from receiving this medicine? Side effects that you should report to your doctor or health care professional as soon as possible:  allergic reactions like  skin rash, itching or hives, swelling of the face, lips, or tongue  decreased sweating and/or rise in body temperature  depression  difficulty breathing, fast or irregular breathing patterns  difficulty speaking  difficulty walking or controlling muscle movements  hearing impairment  redness, blistering, peeling or loosening of the skin, including inside the mouth  tingling, pain or numbness in the hands or feet  unusual bleeding or bruising  unusually weak or tired  worsening of mood, thoughts or actions of suicide or dying Side effects that usually do not require medical attention (report to your doctor or health care professional if they continue or are bothersome):  altered taste  back pain, joint or muscle aches and pains  diarrhea, or constipation  headache  loss of appetite  nausea  stomach upset, indigestion  tremors This list may not describe all possible side effects. Call your doctor for medical advice about side effects. You may report side effects to FDA at 1-800-FDA-1088. Where should I keep my medicine? Keep out of the reach of children. Store at room temperature between 15 and 30 degrees C (59 and 86 degrees F) in a tightly closed container. Protect from moisture. Throw away any unused medicine after the expiration date. NOTE: This sheet is a summary. It may not cover all possible information. If you have questions about this medicine, talk to your doctor, pharmacist, or health care   provider.  2020 [employer] Standard (2013-08-05 23:17:57) Rizatriptan tablets What is this medicine? RIZATRIPTAN (rye za TRIP tan) is used to treat migraines with or without aura. An aura is a strange feeling or visual disturbance that warns you of an attack. It is not used to prevent migraines. This medicine may be used for other purposes; ask your health care provider or pharmacist if you have questions. COMMON BRAND NAME(S): Maxalt What should I tell my health care  provider before I take this medicine? They need to know if you have any of these conditions:  cigarette smoker  circulation problems in fingers and toes  diabetes  heart disease  high blood pressure  high cholesterol  history of irregular heartbeat  history of stroke  kidney disease  liver disease  stomach or intestine problems  an unusual or allergic reaction to rizatriptan, other medicines, foods, dyes, or preservatives  pregnant or trying to get pregnant  breast-feeding How should I use this medicine? Take this medicine by mouth with a glass of water. Follow the directions on the prescription label. Do not take it more often than directed. Talk to your pediatrician regarding the use of this medicine in children. While this drug may be prescribed for children as young as 6 years for selected conditions, precautions do apply. Overdosage: If you think you have taken too much of this medicine contact a poison control center or emergency room at once. NOTE: This medicine is only for you. Do not share this medicine with others. What if I miss a dose? This does not apply. This medicine is not for regular use. What may interact with this medicine? Do not take this medicine with any of the following medicines:  certain medicines for migraine headache like almotriptan, eletriptan, frovatriptan, naratriptan, rizatriptan, sumatriptan, zolmitriptan  ergot alkaloids like dihydroergotamine, ergonovine, ergotamine, methylergonovine  MAOIs like Carbex, Eldepryl, Marplan, Nardil, and Parnate This medicine may also interact with the following medications:  certain medicines for depression, anxiety, or psychotic disorders  propranolol This list may not describe all possible interactions. Give your health care provider a list of all the medicines, herbs, non-prescription drugs, or dietary supplements you use. Also tell them if you smoke, drink alcohol, or use illegal drugs. Some items  may interact with your medicine. What should I watch for while using this medicine? Visit your healthcare professional for regular checks on your progress. Tell your healthcare professional if your symptoms do not start to get better or if they get worse. You may get drowsy or dizzy. Do not drive, use machinery, or do anything that needs mental alertness until you know how this medicine affects you. Do not stand up or sit up quickly, especially if you are an older patient. This reduces the risk of dizzy or fainting spells. Alcohol may interfere with the effect of this medicine. Your mouth may get dry. Chewing sugarless gum or sucking hard candy and drinking plenty of water may help. Contact your healthcare professional if the problem does not go away or is severe. If you take migraine medicines for 10 or more days a month, your migraines may get worse. Keep a diary of headache days and medicine use. Contact your healthcare professional if your migraine attacks occur more frequently. What side effects may I notice from receiving this medicine? Side effects that you should report to your doctor or health care professional as soon as possible:  allergic reactions like skin rash, itching or hives, swelling of the face, lips, or  tongue  chest pain or chest tightness  signs and symptoms of a dangerous change in heartbeat or heart rhythm like chest pain; dizziness; fast, irregular heartbeat; palpitations; feeling faint or lightheaded; falls; breathing problems  signs and symptoms of a stroke like changes in vision; confusion; trouble speaking or understanding; severe headaches; sudden numbness or weakness of the face, arm or leg; trouble walking; dizziness; loss of balance or coordination  signs and symptoms of serotonin syndrome like irritable; confusion; diarrhea; fast or irregular heartbeat; muscle twitching; stiff muscles; trouble walking; sweating; high fever; seizures; chills; vomiting Side effects  that usually do not require medical attention (report to your doctor or health care professional if they continue or are bothersome):  diarrhea  dizziness  drowsiness  dry mouth  headache  nausea, vomiting  pain, tingling, numbness in the hands or feet  stomach pain This list may not describe all possible side effects. Call your doctor for medical advice about side effects. You may report side effects to FDA at 1-800-FDA-1088. Where should I keep my medicine? Keep out of the reach of children. Store at room temperature between 15 and 30 degrees C (59 and 86 degrees F). Keep container tightly closed. Throw away any unused medicine after the expiration date. NOTE: This sheet is a summary. It may not cover all possible information. If you have questions about this medicine, talk to your doctor, pharmacist, or health care provider.  2020 [employer] Standard (2018-02-13 14:59:59)

## 2019-08-05 NOTE — Telephone Encounter (Signed)
I called patient and LVM to schedule 8-12 week follow-up per Dr. Jaynee Eagles. Also advised pt of sending rx to mail in pharmacy. Requested patient call back.

## 2019-08-05 NOTE — Telephone Encounter (Signed)
Pt has been scheduled for 02-15 for her 8-12 week f/u

## 2019-08-06 ENCOUNTER — Telehealth: Payer: Self-pay | Admitting: *Deleted

## 2019-08-06 MED ORDER — RIZATRIPTAN BENZOATE 10 MG PO TBDP: 10.0000 mg | ORAL_TABLET | ORAL | 3 refills | Status: DC | PRN

## 2019-08-06 NOTE — Telephone Encounter (Signed)
Noted thank you

## 2019-08-06 NOTE — Addendum Note (Signed)
Addended by: Star Age on: 08/06/2019 04:11 PM   Modules accepted: Orders

## 2019-08-06 NOTE — Telephone Encounter (Signed)
We received a fax from [facility] seeking to clarify the quantity for Rizatriptan. 30 tablets were ordered. Will check with work-in MD to clarify the quantity.

## 2019-08-06 NOTE — Telephone Encounter (Signed)
I reviewed the Maxalt prescription, I would assume that this is a 90-day supply, maximum 10 pills/month was Clarified on the prescription and I changed the additional instructions to indicate that this is a 90-day supply, not a 30-day supply. Rx resent.

## 2019-09-17 NOTE — Progress Notes (Signed)
[id number]    Provider:  Dr Jaynee Eagles Requesting Provider: Elby Beck, FNP Primary Care Provider:  Elby Beck, FNP  CC:  Migraines  Interval update: The Topiramate made her sick, couldn't take it. Most of the month she has headaches. She thinks the topiramate did help. Riztriptan did no thelp the headache came back in 30 minutes. Unknown triggers, she kept a food diary, she is planning to get the MRI of the brain this year, it was ordered last August, Endocrinologist Dr. Kelton Pillar.   HPI:  Cynthia Spears is a 29 y.o. female here as requested by Elby Beck, FNP for  for migraines. They started in 2014 and she was diagnosed with prolactinoma in 2014. More on the right but it can be bilateral in the occipitoparietal areas. Dad had migraines. She gets nausea, laying the dark quiet room helps, she can wake up with the headaches or anytime, light and sound sensitivity. They can be moerately severe to severe. Can last 24-72 hours. She has 25 headache days a month and 10 migraine days a month. Ongoing for years. Father has migraines. No aura. No medication overuse. No other focal neurologic deficits, associated symptoms, inciting events or modifiable factors.  Reviewed notes, labs and imaging from outside physicians, which showed:  CMP normal 04/12/2019  Review of Systems: Patient complains of symptoms per HPI as well as the following symptoms: headaches. Pertinent negatives and positives per HPI. All others negative.   Social History   Socioeconomic History  . Marital status: Married    Spouse name: Not on file  . Number of children: Not on file  . Years of education: certificate post HS  . Highest education level: Not on file  Occupational History  . Not on file  Tobacco Use  . Smoking status: Never Smoker  . Smokeless tobacco: Never Used  Substance and Sexual Activity  . Alcohol use: Yes    Comment: once every other month  . Drug use:  Never  . Sexual activity: Yes    Birth control/protection: I.U.D.  Other Topics Concern  . Not on file  Social History Narrative   Lives at home with spouse   Right handed   Caffeine: 1 cup/day of coffee   Social Determinants of Health   Financial Resource Strain:   . Difficulty of Paying Living Expenses: Not on file  Food Insecurity:   . Worried About Charity fundraiser in the Last Year: Not on file  . Ran Out of Food in the Last Year: Not on file  Transportation Needs:   . Lack of Transportation (Medical): Not on file  . Lack of Transportation (Non-Medical): Not on file  Physical Activity:   . Days of Exercise per Week: Not on file  . Minutes of Exercise per Session: Not on file  Stress:   . Feeling of Stress : Not on file  Social Connections:   . Frequency of Communication with Friends and Family: Not on file  . Frequency of Social Gatherings with Friends and Family: Not on file  . Attends Religious Services: Not on file  . Active Member of Clubs or Organizations: Not on file  . Attends Archivist Meetings: Not on file  . Marital Status: Not on file  Intimate Partner Violence:   . Fear of Current or Ex-Partner: Not on file  . Emotionally Abused: Not on file  . Physically Abused: Not on file  . Sexually Abused: Not on file  Family History  Problem Relation Age of Onset  . Hyperlipidemia Mother   . Irritable bowel syndrome Mother   . Hypothyroidism Mother   . Learning disabilities Mother   . Hypertension Father   . Hyperlipidemia Father   . Arthritis Father   . Other Father        LAD 50% blockage  . Migraines Father        in his teens   . Healthy Sister   . Healthy Brother   . Arthritis Maternal Grandmother   . Stroke Maternal Grandmother   . Hypothyroidism Maternal Grandmother   . Alzheimer's disease Maternal Grandmother   . Diabetes Maternal Grandmother   . Hypertension Maternal Grandmother   . Alcohol abuse Maternal Grandfather   .  Diabetes Maternal Grandfather   . Heart attack Maternal Grandfather   . Heart disease Maternal Grandfather   . Hypertension Maternal Grandfather   . Hyperlipidemia Maternal Grandfather   . Arthritis Paternal Grandmother   . Diabetes Paternal Grandmother   . Hypertension Paternal Grandmother   . Hyperlipidemia Paternal Grandmother   . Atrial fibrillation Paternal Grandmother   . Heart attack Paternal Grandfather   . Hypertension Paternal Grandfather   . Diabetes Paternal Grandfather     Past Medical History:  Diagnosis Date  . Heart murmur   . History of anorexia nervosa   . History of chicken pox   . Migraine   . Prolactinoma [facility])     Patient Active Problem List   Diagnosis Date Noted  . Chronic migraine without aura without status migrainosus, not intractable 08/01/2019  . Chronic nonintractable headache 04/15/2019  . 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 Prolactinoma ([place]) 04/12/2019  . Murmur 04/06/2018  . 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 APPENDECTOMY  2008  . KNEE ARTHROSCOPY Right 2019  . SHOULDER ARTHROSCOPY Right 2011    Current Outpatient Medications  Medication Sig Dispense Refill  . cabergoline (DOSTINEX) 0.5 MG tablet Take 1 tablet by mouth 2 (two) times a week.    Marland Kitchen levonorgestrel (MIRENA) 20 MCG/24HR IUD 1 each by Intrauterine route once.    . nortriptyline (PAMELOR) 25 MG capsule Take 1 capsule (25 mg total) by mouth at bedtime. 30 capsule 3  . Rimegepant Sulfate (NURTEC) 75 MG TBDP Take 75 mg by mouth daily as needed. For migraines. Take as close to onset of migraine as possible. One daily maximum. 10 tablet 6   No current facility-administered medications for this visit.    Allergies as of 09/18/2019  . (No Known Allergies)    Vitals: BP (!) 97/53 (BP Location: Left Arm, Patient Position: Sitting)   Pulse (!) 53   Temp (!) 97.4 F (36.3 C) Comment: taken at front  Ht 5\' 3"  (1.6  m)   Wt 159 lb (72.1 kg)   BMI 28.17 kg/m  Last Weight:  Wt Readings from Last 1 Encounters:  09/18/19 159 lb (72.1 kg)   Last Height:   Ht Readings from Last 1 Encounters:  09/18/19 5\' 3"  (1.6 m)     Physical exam: Exam: Gen: NAD, conversant, well nourised, well groomed                     CV: RRR, no MRG. No Carotid Bruits. No peripheral edema, warm, nontender Eyes: Conjunctivae clear without exudates or hemorrhage  Neuro: Detailed Neurologic Exam  Speech:    Speech is normal; fluent and spontaneous with normal  comprehension.  Cognition:    The patient is oriented to person, place, and time;     recent and remote memory intact;     language fluent;     normal attention, concentration,     fund of knowledge Cranial Nerves:    The pupils are equal, round, and reactive to light. The fundi are normal and spontaneous venous pulsations are present. Visual fields are full to finger confrontation. Extraocular movements are intact. Trigeminal sensation is intact and the muscles of mastication are normal. The face is symmetric. The palate elevates in the midline. Hearing intact. Voice is normal. Shoulder shrug is normal. The tongue has normal motion without fasciculations.   Coordination:    Normal finger to nose and heel to shin. Normal rapid alternating movements.   Gait:    Heel-toe and tandem gait are normal.   Motor Observation:    No asymmetry, no atrophy, and no involuntary movements noted. Tone:    Normal muscle tone.    Posture:    Posture is normal. normal erect    Strength:    Strength is V/V in the upper and lower limbs.      Sensation: intact to LT     Reflex Exam:  DTR's:    Deep tendon reflexes in the upper and lower extremities are normal bilaterally.   Toes:    The toes are downgoing bilaterally.   Clonus:    Clonus is absent.     Assessment/Plan:  Patient with migraines. We had a long discussion about the options for acute and preventative  management. We will also see her back in the office in 3-4 months. Failed topiramate. Try Nortriptyline. Triptans contraindicated due to her Cabergoline.   -If she fails nortriptyline, consider Botox, discussed briefly I am comfortable performing botox during pregnancy with proper conversation with patient about risks -Discussed pregnancy, see me prior to getting pregnant,  -I recommend MRI brain w/wo contrast, she is getting it donw, ordered by Endocrinology -Discontinue Nurtec prior to pregnancy and we can try Reglan if pregnant -Discussed: Nortriptyline and reglan are considered low risk in pregnancy but no medication is entirely safe. There have been rare associations with Nortriptyline and limb anomalies and cardiac anomalies and other birth defects, still I do use nortriptyline in pregnancy I would just recommend stopping until she is done with the first trimester and coming back to see me for reevaluation before restarting. Advised she may have to still get approval from OBGYN for any medications in pregnancy   Discussed: To prevent or relieve headaches, try the following: Cool Compress. Lie down and place a cool compress on your head.  Avoid headache triggers. If certain foods or odors seem to have triggered your migraines in the past, avoid them. A headache diary might help you identify triggers.  Include physical activity in your daily routine. Try a daily walk or other moderate aerobic exercise.  Manage stress. Find healthy ways to cope with the stressors, such as delegating tasks on your to-do list.  Practice relaxation techniques. Try deep breathing, yoga, massage and visualization.  Eat regularly. Eating regularly scheduled meals and maintaining a healthy diet might help prevent headaches. Also, drink plenty of fluids.  Follow a regular sleep schedule. Sleep deprivation might contribute to headaches Consider biofeedback. With this mind-body technique, you learn to control certain  bodily functions -- such as muscle tension, heart rate and blood pressure -- to prevent headaches or reduce headache pain.    Proceed to emergency  room if you experience new or worsening symptoms or symptoms do not resolve, if you have new neurologic symptoms or if headache is severe, or for any concerning symptom.   Provided education and documentation from American headache [employer] toolbox including articles on: chronic migraine medication overuse headache, chronic migraines, prevention of migraines, behavioral and other nonpharmacologic treatments for headache.   Meds ordered this encounter  Medications  . nortriptyline (PAMELOR) 25 MG capsule    Sig: Take 1 capsule (25 mg total) by mouth at bedtime.    Dispense:  30 capsule    Refill:  3  . Rimegepant Sulfate (NURTEC) 75 MG TBDP    Sig: Take 75 mg by mouth daily as needed. For migraines. Take as close to onset of migraine as possible. One daily maximum.    Dispense:  10 tablet    Refill:  6    Patient has copay card; she can have medication regardless of insurance approval or copay amount.    A total of 25 minutes was spent on this patient's care, reviewing imaging, past records, recent hospitalization notes and results. Over half this time was spent on counseling patient on the  1. Chronic migraine without aura without status migrainosus, not intractable    diagnosis and different diagnostic and therapeutic options, counseling and coordination of care, risks and benefitsof management, compliance, or risk factor reduction and education.    Cc: Elby Beck, FNP  Sarina Ill, MD  [facility] [address] [place], [place] [address]  Phone [phone] Fax [phone]

## 2019-09-18 ENCOUNTER — Ambulatory Visit (INDEPENDENT_AMBULATORY_CARE_PROVIDER_SITE_OTHER): Payer: Managed Care, Other (non HMO) | Admitting: Neurology

## 2019-09-18 ENCOUNTER — Other Ambulatory Visit: Payer: Self-pay

## 2019-09-18 ENCOUNTER — Telehealth: Payer: Self-pay | Admitting: *Deleted

## 2019-09-18 ENCOUNTER — Encounter: Payer: Self-pay | Admitting: Neurology

## 2019-09-18 VITALS — BP 97/53 | HR 53 | Temp 97.4°F | Ht 63.0 in | Wt 159.0 lb

## 2019-09-18 DIAGNOSIS — G43709 Chronic migraine without aura, not intractable, without status migrainosus: Secondary | ICD-10-CM

## 2019-09-18 MED ORDER — NURTEC 75 MG PO TBDP: 75.0000 mg | ORAL_TABLET | Freq: Every day | ORAL | 6 refills | Status: DC | PRN

## 2019-09-18 MED ORDER — NORTRIPTYLINE HCL 25 MG PO CAPS: 25.0000 mg | ORAL_CAPSULE | Freq: Every day | ORAL | 3 refills | Status: DC

## 2019-09-18 NOTE — Patient Instructions (Signed)
Nortriptyline at bedtime for migraine prevention Acutely Nurtec: Please take one tablet at the onset of your headache. Consider Botox  OnabotulinumtoxinA injection (Medical Use) What is this medicine? ONABOTULINUMTOXINA (o na BOTT you lye num tox in eh) is a neuro-muscular blocker. This medicine is used to treat crossed eyes, eyelid spasms, severe neck muscle spasms, ankle and toe muscle spasms, and elbow, wrist, and finger muscle spasms. It is also used to treat excessive underarm sweating, to prevent chronic migraine headaches, and to treat loss of bladder control due to neurologic conditions such as multiple sclerosis or spinal cord injury. This medicine may be used for other purposes; ask your health care provider or pharmacist if you have questions. COMMON BRAND NAME(S): Botox What should I tell my health care provider before I take this medicine? They need to know if you have any of these conditions:  breathing problems  cerebral palsy spasms  difficulty urinating  heart problems  history of surgery where this medicine is going to be used  infection at the site where this medicine is going to be used  myasthenia gravis or other neurologic disease  nerve or muscle disease  surgery plans  take medicines that treat or prevent blood clots  thyroid problems  an unusual or allergic reaction to botulinum toxin, albumin, other medicines, foods, dyes, or preservatives  pregnant or trying to get pregnant  breast-feeding How should I use this medicine? This medicine is for injection into a muscle. It is given by a health care professional in a hospital or clinic setting. Talk to your pediatrician regarding the use of this medicine in children. While this drug may be prescribed for children as young as 51 years old for selected conditions, precautions do apply. Overdosage: If you think you have taken too much of this medicine contact a poison control center or emergency room at  once. NOTE: This medicine is only for you. Do not share this medicine with others. What if I miss a dose? This does not apply. What may interact with this medicine?  aminoglycoside antibiotics like gentamicin, neomycin, tobramycin  muscle relaxants  other botulinum toxin injections This list may not describe all possible interactions. Give your health care provider a list of all the medicines, herbs, non-prescription drugs, or dietary supplements you use. Also tell them if you smoke, drink alcohol, or use illegal drugs. Some items may interact with your medicine. What should I watch for while using this medicine? Visit your doctor for regular check ups. This medicine will cause weakness in the muscle where it is injected. Tell your doctor if you feel unusually weak in other muscles. Get medical help right away if you have problems with breathing, swallowing, or talking. This medicine might make your eyelids droop or make you see blurry or double. If you have weak muscles or trouble seeing do not drive a car, use machinery, or do other dangerous activities. This medicine contains albumin from human blood. It may be possible to pass an infection in this medicine, but no cases have been reported. Talk to your doctor about the risks and benefits of this medicine. If your activities have been limited by your condition, go back to your regular routine slowly after treatment with this medicine. What side effects may I notice from receiving this medicine? Side effects that you should report to your doctor or health care professional as soon as possible:  allergic reactions like skin rash, itching or hives, swelling of the face, lips, or tongue  breathing problems  changes in vision  chest pain or tightness  eye irritation, pain  fast, irregular heartbeat  infection  numbness  speech problems  swallowing problems  unusual weakness Side effects that usually do not require medical  attention (report to your doctor or health care professional if they continue or are bothersome):  bruising or pain at site where injected  drooping eyelid  dry eyes or mouth  headache  muscles aches, pains  sensitivity to light  tearing This list may not describe all possible side effects. Call your doctor for medical advice about side effects. You may report side effects to FDA at 1-800-FDA-1088. Where should I keep my medicine? This drug is given in a hospital or clinic and will not be stored at home. NOTE: This sheet is a summary. It may not cover all possible information. If you have questions about this medicine, talk to your doctor, pharmacist, or health care provider.  2020 Elsevier/Gold Standard (2018-02-05 14:21:42) Rimegepant oral dissolving tablet What is this medicine? RIMEGEPANT (ri ME je pant) is used to treat migraine headaches with or without aura. An aura is a strange feeling or visual disturbance that warns you of an attack. It is not used to prevent migraines. This medicine may be used for other purposes; ask your health care provider or pharmacist if you have questions. COMMON BRAND NAME(S): NURTEC ODT What should I tell my health care provider before I take this medicine? They need to know if you have any of these conditions:  kidney disease  liver disease  an unusual or allergic reaction to rimegepant, other medicines, foods, dyes, or preservatives  pregnant or trying to get pregnant  breast-feeding How should I use this medicine? Take the medicine by mouth. Follow the directions on the prescription label. Leave the tablet in the sealed blister pack until you are ready to take it. With dry hands, open the blister and gently remove the tablet. If the tablet breaks or crumbles, throw it away and take a new tablet out of the blister pack. Place the tablet in the mouth and allow it to dissolve, and then swallow. Do not cut, crush, or chew this medicine. You  do not need water to take this medicine. Talk to your pediatrician about the use of this medicine in children. Special care may be needed. Overdosage: If you think you have taken too much of this medicine contact a poison control center or emergency room at once. NOTE: This medicine is only for you. Do not share this medicine with others. What if I miss a dose? This does not apply. This medicine is not for regular use. What may interact with this medicine? This medicine may interact with the following medications:  certain medicines for fungal infections like fluconazole, itraconazole  rifampin This list may not describe all possible interactions. Give your health care provider a list of all the medicines, herbs, non-prescription drugs, or dietary supplements you use. Also tell them if you smoke, drink alcohol, or use illegal drugs. Some items may interact with your medicine. What should I watch for while using this medicine? Visit your health care professional for regular checks on your progress. Tell your health care professional if your symptoms do not start to get better or if they get worse. What side effects may I notice from receiving this medicine? Side effects that you should report to your doctor or health care professional as soon as possible:  allergic reactions like skin rash, itching or  hives; swelling of the face, lips, or tongue Side effects that usually do not require medical attention (report these to your doctor or health care professional if they continue or are bothersome):  nausea This list may not describe all possible side effects. Call your doctor for medical advice about side effects. You may report side effects to FDA at 1-800-FDA-1088. Where should I keep my medicine? Keep out of the reach of children. Store at room temperature between 15 and 30 degrees C (59 and 86 degrees F). Throw away any unused medicine after the expiration date. NOTE: This sheet is a  summary. It may not cover all possible information. If you have questions about this medicine, talk to your doctor, pharmacist, or health care provider.  2020 Elsevier/Gold Standard (2018-10-15 00:21:31) Nortriptyline capsules What is this medicine? NORTRIPTYLINE (nor TRIP ti leen) is used to treat depression. This medicine may be used for other purposes; ask your health care provider or pharmacist if you have questions. COMMON BRAND NAME(S): Aventyl, Pamelor What should I tell my health care provider before I take this medicine? They need to know if you have any of these conditions:  bipolar disorder  Brugada syndrome  difficulty passing urine  glaucoma  heart disease  if you drink alcohol  liver disease  schizophrenia  seizures  suicidal thoughts, plans or attempt; a previous suicide attempt by you or a family member  thyroid disease  an unusual or allergic reaction to nortriptyline, other tricyclic antidepressants, other medicines, foods, dyes, or preservatives  pregnant or trying to get pregnant  breast-feeding How should I use this medicine? Take this medicine by mouth with a glass of water. Follow the directions on the prescription label. Take your doses at regular intervals. Do not take it more often than directed. Do not stop taking this medicine suddenly except upon the advice of your doctor. Stopping this medicine too quickly may cause serious side effects or your condition may worsen. A special MedGuide will be given to you by the pharmacist with each prescription and refill. Be sure to read this information carefully each time. Talk to your pediatrician regarding the use of this medicine in children. Special care may be needed. Overdosage: If you think you have taken too much of this medicine contact a poison control center or emergency room at once. NOTE: This medicine is only for you. Do not share this medicine with others. What if I miss a dose? If you miss  a dose, take it as soon as you can. If it is almost time for your next dose, take only that dose. Do not take double or extra doses. What may interact with this medicine? Do not take this medicine with any of the following medications:  cisapride  dronedarone  linezolid  MAOIs like Carbex, Eldepryl, Marplan, Nardil, and Parnate  methylene blue (injected into a vein)  pimozide  thioridazine This medicine may also interact with the following medications:  alcohol  antihistamines for allergy, cough, and cold  atropine  certain medicines for bladder problems like oxybutynin, tolterodine  certain medicines for depression like amitriptyline, fluoxetine, sertraline  certain medicines for Parkinson's disease like benztropine, trihexyphenidyl  certain medicines for stomach problems like dicyclomine, hyoscyamine  certain medicines for travel sickness like scopolamine  chlorpropamide  cimetidine  ipratropium  other medicines that prolong the QT interval (an abnormal heart rhythm) like dofetilide  other medicines that can cause serotonin syndrome like St. John's Wort, fentanyl, lithium, tramadol, tryptophan, buspirone, and some medicines  for headaches like sumatriptan or rizatriptan  quinidine  reserpine  thyroid medicine This list may not describe all possible interactions. Give your health care provider a list of all the medicines, herbs, non-prescription drugs, or dietary supplements you use. Also tell them if you smoke, drink alcohol, or use illegal drugs. Some items may interact with your medicine. What should I watch for while using this medicine? Tell your doctor if your symptoms do not get better or if they get worse. Visit your doctor or health care professional for regular checks on your progress. Because it may take several weeks to see the full effects of this medicine, it is important to continue your treatment as prescribed by your doctor. Patients and their  families should watch out for new or worsening thoughts of suicide or depression. Also watch out for sudden changes in feelings such as feeling anxious, agitated, panicky, irritable, hostile, aggressive, impulsive, severely restless, overly excited and hyperactive, or not being able to sleep. If this happens, especially at the beginning of treatment or after a change in dose, call your health care professional. Dennis Bast may get drowsy or dizzy. Do not drive, use machinery, or do anything that needs mental alertness until you know how this medicine affects you. Do not stand or sit up quickly, especially if you are an older patient. This reduces the risk of dizzy or fainting spells. Alcohol may interfere with the effect of this medicine. Avoid alcoholic drinks. Do not treat yourself for coughs, colds, or allergies without asking your doctor or health care professional for advice. Some ingredients can increase possible side effects. Your mouth may get dry. Chewing sugarless gum or sucking hard candy, and drinking plenty of water may help. Contact your doctor if the problem does not go away or is severe. This medicine may cause dry eyes and blurred vision. If you wear contact lenses you may feel some discomfort. Lubricating drops may help. See your eye doctor if the problem does not go away or is severe. This medicine can cause constipation. Try to have a bowel movement at least every 2 to 3 days. If you do not have a bowel movement for 3 days, call your doctor or health care professional. This medicine can make you more sensitive to the sun. Keep out of the sun. If you cannot avoid being in the sun, wear protective clothing and use sunscreen. Do not use sun lamps or tanning beds/booths. What side effects may I notice from receiving this medicine? Side effects that you should report to your doctor or health care professional as soon as possible:  allergic reactions like skin rash, itching or hives, swelling of the  face, lips, or tongue  anxious  breathing problems  changes in vision  confusion  elevated mood, decreased need for sleep, racing thoughts, impulsive behavior  eye pain  fast, irregular heartbeat  feeling faint or lightheaded, falls  feeling agitated, angry, or irritable  fever with increased sweating  hallucination, loss of contact with reality  seizures  stiff muscles  suicidal thoughts or other mood changes  tingling, pain, or numbness in the feet or hands  trouble passing urine or change in the amount of urine  trouble sleeping  unusually weak or tired  vomiting  yellowing of the eyes or skin Side effects that usually do not require medical attention (report to your doctor or health care professional if they continue or are bothersome):  change in sex drive or performance  change in appetite or  weight  constipation  dizziness  dry mouth  nausea  tired  tremors  upset stomach This list may not describe all possible side effects. Call your doctor for medical advice about side effects. You may report side effects to FDA at 1-800-FDA-1088. Where should I keep my medicine? Keep out of the reach of children. Store at room temperature between 15 and 30 degrees C (59 and 86 degrees F). Keep container tightly closed. Throw away any unused medicine after the expiration date. NOTE: This sheet is a summary. It may not cover all possible information. If you have questions about this medicine, talk to your doctor, pharmacist, or health care provider.  2020 Elsevier/Gold Standard (2018-07-24 13:24:58)

## 2019-09-18 NOTE — Telephone Encounter (Signed)
Filled out [employer] form for Nurtec PA, on Dr Cathren Laine desk for [employer].

## 2019-09-18 NOTE — Telephone Encounter (Signed)
[employer] Nurtec PA form signed, faxed to [place].

## 2019-09-24 ENCOUNTER — Encounter: Payer: Self-pay | Admitting: *Deleted

## 2019-09-24 NOTE — Telephone Encounter (Signed)
Received fax from [employer]: Nurtec odt 75 mg tabs approved from 09/20/19 through 09/19/2020. Notifed patient via my chart and successfully faxed approval letter to [facility].

## 2019-09-27 NOTE — Telephone Encounter (Signed)
Terri @ [facility] has called to inform RN that the Nurtec  Is not stocked there, this will need to be sent to a local pharmacy(exa: [facility] or [facility]) if there is a need to call please call [phone] [id number]

## 2019-09-30 ENCOUNTER — Other Ambulatory Visit: Payer: Self-pay | Admitting: Neurology

## 2019-09-30 ENCOUNTER — Ambulatory Visit: Payer: Managed Care, Other (non HMO) | Admitting: Neurology

## 2019-09-30 ENCOUNTER — Encounter: Payer: Self-pay | Admitting: Neurology

## 2019-09-30 MED ORDER — NURTEC 75 MG PO TBDP: 75.0000 mg | ORAL_TABLET | Freq: Every day | ORAL | 6 refills | Status: DC | PRN

## 2019-09-30 NOTE — Telephone Encounter (Signed)
Called the patient, there was no answer. LVM advising the patient to either call back or reply to Cynthia Spears.  IF pt calls back I just need to know what local pharmacy we need to use to send the medication nurtec to.

## 2019-09-30 NOTE — Telephone Encounter (Signed)
I have sent the patient a mychart message asking where I should send the refill locally. There was no local pharmacy on file.

## 2019-10-04 ENCOUNTER — Encounter: Payer: Managed Care, Other (non HMO) | Admitting: Family Medicine

## 2019-10-11 ENCOUNTER — Encounter: Payer: Managed Care, Other (non HMO) | Admitting: Family Medicine

## 2019-11-20 ENCOUNTER — Other Ambulatory Visit: Payer: Managed Care, Other (non HMO)

## 2019-11-22 ENCOUNTER — Ambulatory Visit
Admission: RE | Admit: 2019-11-22 | Discharge: 2019-11-22 | Disposition: A | Discharge status: Home / Self Care | Payer: Managed Care, Other (non HMO) | Source: Ambulatory Visit | Attending: Internal Medicine | Admitting: Internal Medicine

## 2019-11-22 DIAGNOSIS — D352 Benign neoplasm of pituitary gland: Secondary | ICD-10-CM

## 2019-11-22 MED ORDER — GADOBENATE DIMEGLUMINE 529 MG/ML IV SOLN
8.0000 mL | Freq: Once | INTRAVENOUS | Status: AC | PRN
  Administered 2019-11-22: 10:00:00 8 mL via INTRAVENOUS

## 2019-12-19 LAB — OB RESULTS CONSOLE GBS
GBS: NEGATIVE
GBS: NEGATIVE

## 2020-01-03 ENCOUNTER — Other Ambulatory Visit: Payer: Self-pay

## 2020-01-03 ENCOUNTER — Ambulatory Visit (INDEPENDENT_AMBULATORY_CARE_PROVIDER_SITE_OTHER): Payer: Managed Care, Other (non HMO) | Admitting: Internal Medicine

## 2020-01-03 ENCOUNTER — Encounter: Payer: Self-pay | Admitting: Internal Medicine

## 2020-01-03 VITALS — BP 98/58 | HR 53 | Temp 97.8°F | Ht 63.0 in | Wt 161.0 lb

## 2020-01-03 DIAGNOSIS — D352 Benign neoplasm of pituitary gland: Secondary | ICD-10-CM | POA: Diagnosis not present

## 2020-01-03 LAB — COMPREHENSIVE METABOLIC PANEL
ALT: 17 U/L (ref 0–35)
AST: 18 U/L (ref 0–37)
Albumin: 4.2 g/dL (ref 3.5–5.2)
Alkaline Phosphatase: 59 U/L (ref 39–117)
BUN: 9 mg/dL (ref 6–23)
CO2: 30 mEq/L (ref 19–32)
Calcium: 8.9 mg/dL (ref 8.4–10.5)
Chloride: 106 mEq/L (ref 96–112)
Creatinine, Ser: 0.68 mg/dL (ref 0.40–1.20)
GFR: 103.09 mL/min (ref >60.00)
Glucose, Bld: 69 mg/dL — ABNORMAL LOW (ref 70–99)
Potassium: 3.8 mEq/L (ref 3.5–5.1)
Sodium: 137 mEq/L (ref 135–145)
Total Bilirubin: 1.4 mg/dL — ABNORMAL HIGH (ref 0.2–1.2)
Total Protein: 6.9 g/dL (ref 6.0–8.3)

## 2020-01-03 LAB — T4, FREE: Free T4: 0.96 ng/dL (ref 0.60–1.60)

## 2020-01-03 LAB — TSH: TSH: 2.2 u[IU]/mL (ref 0.35–4.50)

## 2020-01-03 NOTE — Progress Notes (Signed)
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 She has followed with [facility] since 04/15/19 for consultative assistance with management of her Prolactinoma.   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 Initial prolactin 200 She initially presented with headaches and irregular cycles followed by galactorrhea  FH of thyroid disease (mother- hypothyroidism )   SUBJECTIVE:   During last visit (07/05/2019 ): Continued on cabergoline   Today (01/03/2020):  Ms. Cynthia Spears is here for a 6 month follow up on prolactinoma. She has chronic headaches  That have been improving with hydration. Denies  vision changes.   She denies any GI side effects   Up to date on eye exam spring 2020   Cabergoline 0.5 mg Twice a week  ( Tuesday and Thursday)   Married, plans on conceiving at the  end of 2021.   IUD in place     ROS:  As per HPI.   HISTORY:  Past Medical History:  Past Medical History:  Diagnosis Date  . Heart murmur   . History of anorexia nervosa   . History of chicken pox   . Migraine   . Prolactinoma [facility])    Past Surgical History:  Past Surgical History:  Procedure Laterality Date  . APPENDECTOMY  2008  . KNEE ARTHROSCOPY Right 2019  . SHOULDER ARTHROSCOPY Right 2011    Social History:  reports that she has never smoked. She has never used smokeless tobacco. She reports current alcohol use. She reports that she does not use drugs. Family History:  Family History  Problem Relation Age of Onset  . Hyperlipidemia Mother   . Irritable bowel syndrome Mother   . Hypothyroidism Mother   .  Learning disabilities Mother   . Hypertension Father   . Hyperlipidemia Father   . Arthritis Father   . Other Father        LAD 50% blockage  . Migraines Father        in his teens   . Healthy Sister   . Healthy Brother   . Arthritis Maternal Grandmother   . Stroke Maternal Grandmother   . Hypothyroidism Maternal Grandmother   . Alzheimer's disease Maternal Grandmother   . Diabetes Maternal Grandmother   . Hypertension Maternal Grandmother   . Alcohol abuse Maternal Grandfather   . Diabetes Maternal Grandfather   . Heart attack Maternal Grandfather   . Heart disease Maternal Grandfather   . Hypertension Maternal Grandfather   . Hyperlipidemia Maternal Grandfather   . Arthritis Paternal Grandmother   . Diabetes Paternal Grandmother   . Hypertension Paternal Grandmother   . Hyperlipidemia Paternal Grandmother   . Atrial fibrillation Paternal Grandmother   . Heart attack Paternal Grandfather   . Hypertension Paternal Grandfather   . Diabetes Paternal Grandfather      HOME MEDICATIONS: Allergies as of 01/03/2020   No Known Allergies     Medication List       Accurate as of Jan 03, 2020 10:04 AM. If you have any questions, ask your nurse or doctor.        cabergoline 0.5 MG tablet Commonly known as: DOSTINEX Take 1  tablet by mouth 2 (two) times a week.   levonorgestrel 20 MCG/24HR IUD Commonly known as: MIRENA 1 each by Intrauterine route once.   nortriptyline 25 MG capsule Commonly known as: PAMELOR Take 1 capsule (25 mg total) by mouth at bedtime.   Nurtec 75 MG Tbdp Generic drug: Rimegepant Sulfate Take 75 mg by mouth daily as needed. For migraines. Take as close to onset of migraine as possible. One daily maximum.         OBJECTIVE:   PHYSICAL EXAM: VS: There were no vitals taken for this visit.   EXAM: General: Pt appears well and is in NAD  Lungs: Clear with good BS bilat with no rales, rhonchi, or wheezes  Heart: Auscultation: RRR. + systolic  murmur ? aortic  Abdomen: Normoactive bowel sounds, soft, nontender, without masses or organomegaly palpable  Extremities:  BL LE: No pretibial edema normal ROM and strength.  Mental Status: Judgment, insight: Intact Orientation: Oriented to time, place, and person Mood and affect: No depression, anxiety, or agitation     DATA REVIEWED: Results for AANIA, BENNETTE (MRN [id number]) as of 01/06/2020 09:18  Ref. Range 04/12/2019 14:01 07/05/2019 13:29 01/03/2020 11:54  Prolactin Latest Units: ng/mL 2.7 2.7 2.6    Results for MARNEE, WILSHUSEN (MRN [id number]) as of 07/05/2019 13:06  Ref. Range 04/12/2019 14:01  Cortisol, Plasma Latest Units: ug/dL 4.1  Prolactin Latest Units: ng/mL 2.7  Glucose Latest Ref Range: 70 - 99 mg/dL 84  TSH Latest Ref Range: 0.35 - 4.50 uIU/mL 2.45  T4,Free(Direct) Latest Ref Range: 0.60 - 1.60 ng/dL 0.86     ASSESSMENT / PLAN / RECOMMENDATIONS:   1. Pituitary Microadenoma    - Cortisol, ACTH and TFT's are normal.  - MRI 2020 stable pituitary microadenoma at 5 mm - No local symptoms  - Pt to have an updated visual field testing   2. Prolactinemia  - No clinical symptom of hyperprolactinemia - Tolerating cabergoline - Pt advised to stop cabergoline with positive pregnancy test  - Will reduce the dose as below    Medications : Continue cabergoline 0.5 mg, HALF a tablet  twice weekly    F/U in 6 months    Signed electronically by: Mack Guise, MD  [facility] End[facility]  [address] [place], [place] [address] Phone: [phone] FAX: [phone]      CC: Elby Beck, [place] [address] Phone: [phone]  Fax: [phone]   Return to Endocrinology clinic as below: Future Appointments  Date Time Provider [place]  01/03/2020 11:10 AM Shamleffer, Melanie Crazier, MD [facility] None

## 2020-01-03 NOTE — Patient Instructions (Signed)
-   Continue cabergoline 0.5 mg twice weekly  - Please have an eye exam to include Visual field testing

## 2020-01-04 LAB — PROLACTIN: Prolactin: 2.6 ng/mL

## 2020-01-06 MED ORDER — CABERGOLINE 0.5 MG PO TABS: 0.2500 mg | ORAL_TABLET | ORAL | 3 refills | Status: AC

## 2020-02-26 ENCOUNTER — Encounter: Payer: Self-pay | Admitting: Internal Medicine

## 2020-03-16 ENCOUNTER — Encounter: Payer: Self-pay | Admitting: Internal Medicine

## 2020-05-08 ENCOUNTER — Encounter: Payer: Self-pay | Admitting: Internal Medicine

## 2020-06-19 LAB — OB RESULTS CONSOLE RUBELLA ANTIBODY, IGM: Rubella: IMMUNE

## 2020-06-19 LAB — OB RESULTS CONSOLE HEPATITIS B SURFACE ANTIGEN: Hepatitis B Surface Ag: NEGATIVE

## 2020-06-19 LAB — OB RESULTS CONSOLE RPR: RPR: NONREACTIVE

## 2020-06-19 LAB — OB RESULTS CONSOLE HIV ANTIBODY (ROUTINE TESTING): HIV: NONREACTIVE

## 2020-07-03 ENCOUNTER — Other Ambulatory Visit: Payer: Self-pay

## 2020-07-03 ENCOUNTER — Ambulatory Visit (INDEPENDENT_AMBULATORY_CARE_PROVIDER_SITE_OTHER): Payer: Managed Care, Other (non HMO) | Admitting: Internal Medicine

## 2020-07-03 ENCOUNTER — Encounter: Payer: Self-pay | Admitting: Internal Medicine

## 2020-07-03 VITALS — BP 110/72 | HR 68 | Ht 63.0 in | Wt 163.0 lb

## 2020-07-03 DIAGNOSIS — D352 Benign neoplasm of pituitary gland: Secondary | ICD-10-CM

## 2020-07-03 NOTE — Patient Instructions (Signed)
-   Please contact us should you notice any out of the ordinary headaches or new vision changes  

## 2020-07-03 NOTE — Progress Notes (Signed)
Name: Cynthia Spears  MRN/ DOB: [id number], Jan 04, 1991    Age/ Sex: 29 y.o., female     PCP: Elby Beck, FNP   Reason for Endocrinology Evaluation: Prolactinoma     Initial Endocrinology Clinic Visit: 04/15/2019    PATIENT IDENTIFIER: Cynthia Spears is a 29 y.o., female with a past medical history of Pituitary microadenoma. She has followed with [facility] since 04/15/19 for consultative assistance with management of her Prolactinoma.   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 Initial prolactin 200 She initially presented with headaches and irregular cycles followed by galactorrhea   She was on Cabergoline half a tablet twice daily until her pregnancy in 04/2020  FH of thyroid disease (mother- hypothyroidism )   SUBJECTIVE:     Today (07/03/2020):  Cynthia Spears is here for a 6 month follow up on prolactinoma. She is currently at 12.1 weeks of gestation EDD 01/14/2021 She has been off Cabergoline since 04/2020  Last visual field testing 02/26/2020 with no visual loss  Denies nausea, vomiting or diarrhea  Headaches are stable  Denies visual changes        HISTORY:  Past Medical History:  Past Medical History:  Diagnosis Date  . Heart murmur   . History of anorexia nervosa   . History of chicken pox   . Migraine   . Prolactinoma [facility])    Past Surgical History:  Past Surgical History:  Procedure Laterality Date  . APPENDECTOMY  2008  . KNEE ARTHROSCOPY Right 2019  . SHOULDER ARTHROSCOPY Right 2011    Social History:  reports that she has never smoked. She has never used smokeless tobacco. She reports current alcohol use. She reports that she does not use drugs. Family History:  Family History  Problem Relation Age of Onset  . Hyperlipidemia Mother   . Irritable bowel syndrome Mother   . Hypothyroidism Mother   . Learning disabilities Mother   . Hypertension Father    . Hyperlipidemia Father   . Arthritis Father   . Other Father        LAD 50% blockage  . Migraines Father        in his teens   . Healthy Sister   . Healthy Brother   . Arthritis Maternal Grandmother   . Stroke Maternal Grandmother   . Hypothyroidism Maternal Grandmother   . Alzheimer's disease Maternal Grandmother   . Diabetes Maternal Grandmother   . Hypertension Maternal Grandmother   . Alcohol abuse Maternal Grandfather   . Diabetes Maternal Grandfather   . Heart attack Maternal Grandfather   . Heart disease Maternal Grandfather   . Hypertension Maternal Grandfather   . Hyperlipidemia Maternal Grandfather   . Arthritis Paternal Grandmother   . Diabetes Paternal Grandmother   . Hypertension Paternal Grandmother   . Hyperlipidemia Paternal Grandmother   . Atrial fibrillation Paternal Grandmother   . Heart attack Paternal Grandfather   . Hypertension Paternal Grandfather   . Diabetes Paternal Grandfather      HOME MEDICATIONS: Allergies as of 07/03/2020   No Known Allergies     Medication List       Accurate as of July 03, 2020 11:11 AM. If you have any questions, ask your nurse or doctor.        STOP taking these medications   levonorgestrel 20 MCG/24HR IUD Commonly known as: MIRENA Stopped by: Dorita Sciara, MD   nortriptyline 25 MG capsule Commonly known  as: PAMELOR Stopped by: Dorita Sciara, MD   Nurtec 75 MG Tbdp Generic drug: Rimegepant Sulfate Stopped by: Dorita Sciara, MD     TAKE these medications   cabergoline 0.5 MG tablet Commonly known as: DOSTINEX Take 0.5 tablets (0.25 mg total) by mouth 2 (two) times a week.   PRE-NATAL FORMULA PO Take by mouth.         OBJECTIVE:   PHYSICAL EXAM: VS: BP 110/72   Pulse 68   Ht 5\' 3"  (1.6 m)   Wt 163 lb (73.9 kg)   SpO2 100%   BMI 28.87 kg/m    EXAM: General: Pt appears well and is in NAD  Lungs: Clear with good BS bilat with no rales, rhonchi, or wheezes   Heart: Auscultation: RRR. + systolic murmur ? aortic  Abdomen: Normoactive bowel sounds, soft, nontender, without masses or organomegaly palpable  Extremities:  BL LE: No pretibial edema normal ROM and strength.  Mental Status: Judgment, insight: Intact Orientation: Oriented to time, place, and person Mood and affect: No depression, anxiety, or agitation     DATA REVIEWED: Results for Cynthia Spears, Cynthia Spears (MRN [id number]) as of 07/03/2020 12:35  Ref. Range 01/03/2020 11:54  Prolactin Latest Units: ng/mL 2.6   Results for Cynthia Spears, Cynthia Spears (MRN [id number]) as of 07/05/2019 13:06  Ref. Range 04/12/2019 14:01  Cortisol, Plasma Latest Units: ug/dL 4.1  Prolactin Latest Units: ng/mL 2.7  Glucose Latest Ref Range: 70 - 99 mg/dL 84  TSH Latest Ref Range: 0.35 - 4.50 uIU/mL 2.45  T4,Free(Direct) Latest Ref Range: 0.60 - 1.60 ng/dL 0.86     ASSESSMENT / PLAN / RECOMMENDATIONS:   1. Pituitary Microadenoma    - Cortisol, ACTH and TFT's are normal.  - MRI 2020 stable pituitary microadenoma at 5 mm - No local symptoms  -She is up-to-date on visual field testing  2. Prolactinemia  -She is currently at 12.2 weeks of gestation, she has been off cabergoline since September 2021 -Patient to notify us with any new visual changes or new onset headaches.  Medications : n/a   F/U in 4 months    Signed electronically by: Mack Guise, MD  [facility] End[facility]in[facility]  [facility] [address] [place], [place] [address] Phone: [phone] FAX: [phone]      CC: Elby Beck, [place] [address] Phone: [phone]  Fax: [phone]   Return to Endocrinology clinic as below: No future appointments.

## 2020-08-15 NOTE — L&D Delivery Note (Signed)
Delivery Note At 9:37 AM a viable female was delivered via ROA Presentation  APGAR: 8 and 9  weight pending .   Placenta status: spontaneously with 3 vessel cord  ,  .  Cord:   with the following complications: loose nuchal cord x 1   .  Cord pH: not obtained  Anesthesia:  epidural Episiotomy: none  Lacerations:  2nd and right labial laceration Suture Repair: 3.0 chromic Est. Blood Loss (mL):  300  Mom to postpartum.  Baby to Couplet care / Skin to Skin.  Cyril Mourning 01/11/2021, 9:54 AM

## 2020-11-02 ENCOUNTER — Ambulatory Visit: Payer: Managed Care, Other (non HMO) | Admitting: Internal Medicine

## 2020-11-20 ENCOUNTER — Ambulatory Visit (INDEPENDENT_AMBULATORY_CARE_PROVIDER_SITE_OTHER): Payer: 59 | Admitting: Internal Medicine

## 2020-11-20 ENCOUNTER — Encounter: Payer: Self-pay | Admitting: Internal Medicine

## 2020-11-20 ENCOUNTER — Other Ambulatory Visit: Payer: Self-pay

## 2020-11-20 VITALS — BP 100/70 | HR 70 | Ht 63.0 in | Wt 202.2 lb

## 2020-11-20 DIAGNOSIS — D352 Benign neoplasm of pituitary gland: Secondary | ICD-10-CM

## 2020-11-20 NOTE — Patient Instructions (Signed)
-   Please contact us should you notice any out of the ordinary headaches or new vision changes

## 2020-11-20 NOTE — Progress Notes (Signed)
Name: Cynthia Spears  MRN/ DOB: [id number], Jan 14, 1991    Age/ Sex: 30 y.o., female     PCP: Elby Beck, FNP (Inactive)   Reason for Endocrinology Evaluation: Prolactinoma     Initial Endocrinology Clinic Visit: 04/15/2019    PATIENT IDENTIFIER: Cynthia Spears is a 30 y.o., female with a past medical history of Pituitary microadenoma. She has followed with [facility] since 04/15/19 for consultative assistance with management of her Prolactinoma.   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 Initial prolactin 200 She initially presented with headaches and irregular cycles followed by galactorrhea   She was on Cabergoline half a tablet twice daily until her pregnancy in 04/2020  FH of thyroid disease (mother- hypothyroidism )   SUBJECTIVE:     Today (11/20/2020):  Cynthia Spears is here for a 6 month follow up on prolactinoma. She is currently at 32 weeks of gestation- boy  EDD 01/14/2021 She has been off Cabergoline since 04/2020  Last visual field testing 02/26/2020 with no visual loss  Denies nausea, vomiting or diarrhea  Headaches are stable  Denies visual changes        HISTORY:  Past Medical History:  Past Medical History:  Diagnosis Date  . Heart murmur   . History of anorexia nervosa   . History of chicken pox   . Migraine   . Prolactinoma [facility])    Past Surgical History:  Past Surgical History:  Procedure Laterality Date  . APPENDECTOMY  2008  . KNEE ARTHROSCOPY Right 2019  . SHOULDER ARTHROSCOPY Right 2011    Social History:  reports that she has never smoked. She has never used smokeless tobacco. She reports current alcohol use. She reports that she does not use drugs. Family History:  Family History  Problem Relation Age of Onset  . Hyperlipidemia Mother   . Irritable bowel syndrome Mother   . Hypothyroidism Mother   . Learning disabilities Mother   .  Hypertension Father   . Hyperlipidemia Father   . Arthritis Father   . Other Father        LAD 50% blockage  . Migraines Father        in his teens   . Healthy Sister   . Healthy Brother   . Arthritis Maternal Grandmother   . Stroke Maternal Grandmother   . Hypothyroidism Maternal Grandmother   . Alzheimer's disease Maternal Grandmother   . Diabetes Maternal Grandmother   . Hypertension Maternal Grandmother   . Alcohol abuse Maternal Grandfather   . Diabetes Maternal Grandfather   . Heart attack Maternal Grandfather   . Heart disease Maternal Grandfather   . Hypertension Maternal Grandfather   . Hyperlipidemia Maternal Grandfather   . Arthritis Paternal Grandmother   . Diabetes Paternal Grandmother   . Hypertension Paternal Grandmother   . Hyperlipidemia Paternal Grandmother   . Atrial fibrillation Paternal Grandmother   . Heart attack Paternal Grandfather   . Hypertension Paternal Grandfather   . Diabetes Paternal Grandfather      HOME MEDICATIONS: Allergies as of 11/20/2020   No Known Allergies     Medication List       Accurate as of November 20, 2020 11:01 AM. If you have any questions, ask your nurse or doctor.        cabergoline 0.5 MG tablet Commonly known as: DOSTINEX Take 0.5 tablets (0.25 mg total) by mouth 2 (two) times a week.   PRE-NATAL FORMULA  PO Take by mouth.         OBJECTIVE:   PHYSICAL EXAM: VS: BP 100/70   Pulse 70   Ht 5\' 3"  (1.6 m)   Wt 202 lb 4 oz (91.7 kg)   SpO2 98%   BMI 35.83 kg/m    EXAM: General: Pt appears well and is in NAD  Lungs: Clear with good BS bilat with no rales, rhonchi, or wheezes  Heart: Auscultation: RRR  Abdomen: Gravid uterus   Extremities:  BL LE: No pretibial edema normal ROM and strength.  Mental Status: Judgment, insight: Intact Orientation: Oriented to time, place, and person Mood and affect: No depression, anxiety, or agitation     DATA REVIEWED: Results for Cynthia Spears, Cynthia Spears (MRN [id number])  as of 07/03/2020 12:35  Ref. Range 01/03/2020 11:54  Prolactin Latest Units: ng/mL 2.6   Results for Cynthia Spears, Cynthia Spears (MRN [id number]) as of 07/05/2019 13:06  Ref. Range 04/12/2019 14:01  Cortisol, Plasma Latest Units: ug/dL 4.1  Prolactin Latest Units: ng/mL 2.7  Glucose Latest Ref Range: 70 - 99 mg/dL 84  TSH Latest Ref Range: 0.35 - 4.50 uIU/mL 2.45  T4,Free(Direct) Latest Ref Range: 0.60 - 1.60 ng/dL 0.86     ASSESSMENT / PLAN / RECOMMENDATIONS:   1. Pituitary Microadenoma    - Cortisol, ACTH and TFT's are normal.  - MRI 2020 stable pituitary microadenoma at 5 mm - No local symptoms  -She is up-to-date on visual field testing, planning on having one soon  2. Prolactinemia  -She is currently at 32 weeks of gestation, she has been off cabergoline since September 2021 -Patient to notify us with any new visual changes or new onset headaches.  Medications : n/a   F/U in 6 months    Signed electronically by: Mack Guise, MD  [facility] Endocrin[facility]  [facility] [address] [place], [place] [address] Phone: [phone] FAX: [phone]      CC: Elby Beck, FNP (Inactive) No address on file Phone: None  Fax: None   Return to Endocrinology clinic as below: No future appointments.

## 2020-12-18 LAB — OB RESULTS CONSOLE GBS: GBS: NEGATIVE

## 2021-01-11 ENCOUNTER — Inpatient Hospital Stay (HOSPITAL_COMMUNITY): Payer: 59 | Admitting: Anesthesiology

## 2021-01-11 ENCOUNTER — Inpatient Hospital Stay (HOSPITAL_COMMUNITY)
Admission: AD | Admit: 2021-01-11 | Discharge: 2021-01-13 | DRG: 805 | Disposition: A | Discharge status: Home / Self Care | Payer: 59 | Attending: Obstetrics and Gynecology | Admitting: Obstetrics and Gynecology

## 2021-01-11 ENCOUNTER — Encounter (HOSPITAL_COMMUNITY): Payer: Self-pay | Admitting: Obstetrics and Gynecology

## 2021-01-11 ENCOUNTER — Other Ambulatory Visit: Payer: Self-pay

## 2021-01-11 DIAGNOSIS — O4690 Antepartum hemorrhage, unspecified, unspecified trimester: Secondary | ICD-10-CM

## 2021-01-11 DIAGNOSIS — O4693 Antepartum hemorrhage, unspecified, third trimester: Secondary | ICD-10-CM | POA: Diagnosis present

## 2021-01-11 DIAGNOSIS — Z3A39 39 weeks gestation of pregnancy: Secondary | ICD-10-CM | POA: Diagnosis not present

## 2021-01-11 DIAGNOSIS — U071 COVID-19: Secondary | ICD-10-CM | POA: Diagnosis present

## 2021-01-11 DIAGNOSIS — O9852 Other viral diseases complicating childbirth: Secondary | ICD-10-CM | POA: Diagnosis present

## 2021-01-11 DIAGNOSIS — Z3490 Encounter for supervision of normal pregnancy, unspecified, unspecified trimester: Secondary | ICD-10-CM

## 2021-01-11 LAB — COMPREHENSIVE METABOLIC PANEL
ALT: 22 U/L (ref 0–44)
AST: 29 U/L (ref 15–41)
Albumin: 2.2 g/dL — ABNORMAL LOW (ref 3.5–5.0)
Alkaline Phosphatase: 206 U/L — ABNORMAL HIGH (ref 38–126)
Anion gap: 8 (ref 5–15)
BUN: 7 mg/dL (ref 6–20)
CO2: 21 mmol/L — ABNORMAL LOW (ref 22–32)
Calcium: 8.4 mg/dL — ABNORMAL LOW (ref 8.9–10.3)
Chloride: 107 mmol/L (ref 98–111)
Creatinine, Ser: 0.6 mg/dL (ref 0.44–1.00)
GFR, Estimated: >60 mL/min (ref >60)
Glucose, Bld: 93 mg/dL (ref 70–99)
Potassium: 3.7 mmol/L (ref 3.5–5.1)
Sodium: 136 mmol/L (ref 135–145)
Total Bilirubin: 0.7 mg/dL (ref 0.3–1.2)
Total Protein: 5.3 g/dL — ABNORMAL LOW (ref 6.5–8.1)

## 2021-01-11 LAB — TYPE AND SCREEN
ABO/RH(D): O POS
Antibody Screen: NEGATIVE

## 2021-01-11 LAB — CBC
HCT: 36.6 % (ref 36.0–46.0)
Hemoglobin: 12.5 g/dL (ref 12.0–15.0)
MCH: 31.8 pg (ref 26.0–34.0)
MCHC: 34.2 g/dL (ref 30.0–36.0)
MCV: 93.1 fL (ref 80.0–100.0)
Platelets: 170 10*3/uL (ref 150–400)
RBC: 3.93 MIL/uL (ref 3.87–5.11)
RDW: 12.2 % (ref 11.5–15.5)
WBC: 7.9 10*3/uL (ref 4.0–10.5)
nRBC: 0 % (ref 0.0–0.2)

## 2021-01-11 LAB — PROTEIN / CREATININE RATIO, URINE
Creatinine, Urine: 84.57 mg/dL
Protein Creatinine Ratio: 0.11 mg/mg{Cre} (ref 0.00–0.15)
Total Protein, Urine: 9 mg/dL

## 2021-01-11 LAB — URINALYSIS, ROUTINE W REFLEX MICROSCOPIC
Bacteria, UA: NONE SEEN
Bilirubin Urine: NEGATIVE
Glucose, UA: NEGATIVE mg/dL
Ketones, ur: NEGATIVE mg/dL
Leukocytes,Ua: NEGATIVE
Nitrite: NEGATIVE
Protein, ur: NEGATIVE mg/dL
RBC / HPF: >50 RBC/hpf — ABNORMAL HIGH (ref 0–5)
Specific Gravity, Urine: 1.011 (ref 1.005–1.030)
pH: 6 (ref 5.0–8.0)

## 2021-01-11 LAB — RESP PANEL BY RT-PCR (FLU A&B, COVID) ARPGX2
Influenza A by PCR: NEGATIVE
Influenza B by PCR: NEGATIVE
SARS Coronavirus 2 by RT PCR: POSITIVE — AB

## 2021-01-11 LAB — RPR: RPR Ser Ql: NONREACTIVE

## 2021-01-11 MED ORDER — ZOLPIDEM TARTRATE 5 MG PO TABS: 5.0000 mg | ORAL_TABLET | Freq: Every evening | ORAL | Status: DC | PRN

## 2021-01-11 MED ORDER — LACTATED RINGERS IV SOLN
500.0000 mL | Freq: Once | INTRAVENOUS | Status: AC
Start: 2021-01-11 — End: 2021-01-11
  Administered 2021-01-11: 500 mL via INTRAVENOUS

## 2021-01-11 MED ORDER — SOD CITRATE-CITRIC ACID 500-334 MG/5ML PO SOLN: 30.0000 mL | ORAL | Status: DC | PRN

## 2021-01-11 MED ORDER — ONDANSETRON HCL 4 MG/2ML IJ SOLN: 4.0000 mg | INTRAMUSCULAR | Status: DC | PRN

## 2021-01-11 MED ORDER — BISACODYL 10 MG RE SUPP: 10.0000 mg | Freq: Every day | RECTAL | Status: DC | PRN

## 2021-01-11 MED ORDER — FLEET ENEMA 7-19 GM/118ML RE ENEM: 1.0000 | ENEMA | Freq: Every day | RECTAL | Status: DC | PRN

## 2021-01-11 MED ORDER — OXYCODONE-ACETAMINOPHEN 5-325 MG PO TABS: 1.0000 | ORAL_TABLET | ORAL | Status: DC | PRN

## 2021-01-11 MED ORDER — LACTATED RINGERS IV SOLN: INTRAVENOUS | Status: DC

## 2021-01-11 MED ORDER — COCONUT OIL OIL: 1.0000 "application " | TOPICAL_OIL | Status: DC | PRN

## 2021-01-11 MED ORDER — LIDOCAINE HCL (PF) 1 % IJ SOLN: 30.0000 mL | INTRAMUSCULAR | Status: DC | PRN

## 2021-01-11 MED ORDER — OXYTOCIN-SODIUM CHLORIDE 30-0.9 UT/500ML-% IV SOLN
2.5000 [IU]/h | INTRAVENOUS | Status: DC
  Filled 2021-01-11: qty 500

## 2021-01-11 MED ORDER — PHENYLEPHRINE 40 MCG/ML (10ML) SYRINGE FOR IV PUSH (FOR BLOOD PRESSURE SUPPORT)
80.0000 ug | PREFILLED_SYRINGE | INTRAVENOUS | Status: DC | PRN
  Filled 2021-01-11: qty 10

## 2021-01-11 MED ORDER — OXYCODONE HCL 5 MG PO TABS: 10.0000 mg | ORAL_TABLET | ORAL | Status: DC | PRN

## 2021-01-11 MED ORDER — LACTATED RINGERS IV SOLN: 500.0000 mL | INTRAVENOUS | Status: DC | PRN

## 2021-01-11 MED ORDER — IBUPROFEN 600 MG PO TABS
600.0000 mg | ORAL_TABLET | Freq: Four times a day (QID) | ORAL | Status: DC
  Administered 2021-01-11 – 2021-01-13 (×8): 600 mg via ORAL
  Filled 2021-01-11 (×7): qty 1

## 2021-01-11 MED ORDER — BENZOCAINE-MENTHOL 20-0.5 % EX AERO: 1.0000 "application " | INHALATION_SPRAY | CUTANEOUS | Status: DC | PRN

## 2021-01-11 MED ORDER — DIPHENHYDRAMINE HCL 25 MG PO CAPS: 25.0000 mg | ORAL_CAPSULE | Freq: Four times a day (QID) | ORAL | Status: DC | PRN

## 2021-01-11 MED ORDER — ACETAMINOPHEN 325 MG PO TABS: 650.0000 mg | ORAL_TABLET | ORAL | Status: DC | PRN

## 2021-01-11 MED ORDER — ONDANSETRON HCL 4 MG/2ML IJ SOLN: 4.0000 mg | Freq: Four times a day (QID) | INTRAMUSCULAR | Status: DC | PRN

## 2021-01-11 MED ORDER — FENTANYL-BUPIVACAINE-NACL 0.5-0.125-0.9 MG/250ML-% EP SOLN
12.0000 mL/h | EPIDURAL | Status: DC | PRN
Start: 2021-01-11 — End: 2021-01-11
  Administered 2021-01-11: 12 mL/h via EPIDURAL
  Filled 2021-01-11: qty 250

## 2021-01-11 MED ORDER — OXYTOCIN BOLUS FROM INFUSION: 333.0000 mL | Freq: Once | INTRAVENOUS | Status: DC

## 2021-01-11 MED ORDER — OXYCODONE HCL 5 MG PO TABS: 5.0000 mg | ORAL_TABLET | ORAL | Status: DC | PRN

## 2021-01-11 MED ORDER — DIPHENHYDRAMINE HCL 50 MG/ML IJ SOLN: 12.5000 mg | INTRAMUSCULAR | Status: DC | PRN

## 2021-01-11 MED ORDER — EPHEDRINE 5 MG/ML INJ
10.0000 mg | INTRAVENOUS | Status: DC | PRN
  Filled 2021-01-11: qty 2

## 2021-01-11 MED ORDER — DIBUCAINE (PERIANAL) 1 % EX OINT: 1.0000 "application " | TOPICAL_OINTMENT | CUTANEOUS | Status: DC | PRN

## 2021-01-11 MED ORDER — OXYCODONE-ACETAMINOPHEN 5-325 MG PO TABS: 2.0000 | ORAL_TABLET | ORAL | Status: DC | PRN

## 2021-01-11 MED ORDER — TETANUS-DIPHTH-ACELL PERTUSSIS 5-2.5-18.5 LF-MCG/0.5 IM SUSY: 0.5000 mL | PREFILLED_SYRINGE | Freq: Once | INTRAMUSCULAR | Status: DC

## 2021-01-11 MED ORDER — MEDROXYPROGESTERONE ACETATE 150 MG/ML IM SUSP: 150.0000 mg | INTRAMUSCULAR | Status: DC | PRN

## 2021-01-11 MED ORDER — LACTATED RINGERS IV BOLUS: 1000.0000 mL | Freq: Once | INTRAVENOUS | Status: DC

## 2021-01-11 MED ORDER — SENNOSIDES-DOCUSATE SODIUM 8.6-50 MG PO TABS
2.0000 | ORAL_TABLET | Freq: Every day | ORAL | Status: DC
  Administered 2021-01-12: 2 via ORAL
  Filled 2021-01-11: qty 2

## 2021-01-11 MED ORDER — WITCH HAZEL-GLYCERIN EX PADS: 1.0000 "application " | MEDICATED_PAD | CUTANEOUS | Status: DC | PRN

## 2021-01-11 MED ORDER — PRENATAL MULTIVITAMIN CH
1.0000 | ORAL_TABLET | Freq: Every day | ORAL | Status: DC
  Administered 2021-01-11 – 2021-01-12 (×2): 1 via ORAL
  Filled 2021-01-11: qty 1

## 2021-01-11 MED ORDER — MEASLES, MUMPS & RUBELLA VAC IJ SOLR: 0.5000 mL | Freq: Once | INTRAMUSCULAR | Status: DC

## 2021-01-11 MED ORDER — SIMETHICONE 80 MG PO CHEW: 80.0000 mg | CHEWABLE_TABLET | ORAL | Status: DC | PRN

## 2021-01-11 MED ORDER — ONDANSETRON HCL 4 MG PO TABS: 4.0000 mg | ORAL_TABLET | ORAL | Status: DC | PRN

## 2021-01-11 MED ORDER — LIDOCAINE HCL (PF) 1 % IJ SOLN
INTRAMUSCULAR | Status: DC | PRN
  Administered 2021-01-11: 8 mL via EPIDURAL

## 2021-01-11 NOTE — MAU Provider Note (Addendum)
History     CSN: [id number]  Arrival date and time: 01/11/21 0136   Event Date/Time   First Provider Initiated Contact with Patient 01/11/21 0222      Chief Complaint  Patient presents with  . Vaginal Bleeding   30 y.o. G1 @39 .4 wks presenting with cramping and VB. Reports sx onset around 1230. Rates pain 5/10. She saw dime sized clots when she went to the [place]. States had sex yesterday afternoon. Denies LOF. Also reports less FM since pain started.     OB History    Gravida  1   Para      Term      Preterm      AB      Living        SAB      IAB      Ectopic      Multiple      Live Births              Past Medical History:  Diagnosis Date  . Heart murmur   . History of anorexia nervosa   . History of chicken pox   . Migraine   . Prolactinoma [facility])     Past Surgical History:  Procedure Laterality Date  . APPENDECTOMY  2008  . KNEE ARTHROSCOPY Right 2019  . SHOULDER ARTHROSCOPY Right 2011    Family History  Problem Relation Age of Onset  . Hyperlipidemia Mother   . Irritable bowel syndrome Mother   . Hypothyroidism Mother   . Learning disabilities Mother   . Hypertension Father   . Hyperlipidemia Father   . Arthritis Father   . Other Father        LAD 50% blockage  . Migraines Father        in his teens   . Healthy Sister   . Healthy Brother   . Arthritis Maternal Grandmother   . Stroke Maternal Grandmother   . Hypothyroidism Maternal Grandmother   . Alzheimer's disease Maternal Grandmother   . Diabetes Maternal Grandmother   . Hypertension Maternal Grandmother   . Alcohol abuse Maternal Grandfather   . Diabetes Maternal Grandfather   . Heart attack Maternal Grandfather   . Heart disease Maternal Grandfather   . Hypertension Maternal Grandfather   . Hyperlipidemia Maternal Grandfather   . Arthritis Paternal Grandmother   . Diabetes Paternal Grandmother   . Hypertension Paternal Grandmother   . Hyperlipidemia Paternal  Grandmother   . Atrial fibrillation Paternal Grandmother   . Heart attack Paternal Grandfather   . Hypertension Paternal Grandfather   . Diabetes Paternal Grandfather     Social History   Tobacco Use  . Smoking status: Never Smoker  . Smokeless tobacco: Never Used  Vaping Use  . Vaping Use: Never used  Substance Use Topics  . Alcohol use: Yes    Comment: once every other month  . Drug use: Never    Allergies: No Known Allergies  Medications Prior to Admission  Medication Sig Dispense Refill Last Dose  . Prenatal Multivit-Min-Fe-FA (PRE-NATAL FORMULA PO) Take by mouth.   01/10/2021 at Unknown time  . cabergoline (DOSTINEX) 0.5 MG tablet Take 0.5 tablets (0.25 mg total) by mouth 2 (two) times a week. (Patient not taking: No sig reported) 12 tablet 3     Review of Systems  Gastrointestinal: Positive for abdominal pain.  Genitourinary: Positive for vaginal bleeding.   Physical Exam   SpO2 98 %.  Physical Exam Vitals and nursing note  reviewed. Exam conducted with a chaperone present.  Constitutional:      General: She is not in acute distress.    Appearance: Normal appearance.  HENT:     Head: Normocephalic and atraumatic.  Pulmonary:     Effort: Pulmonary effort is normal. No respiratory distress.  Abdominal:     Palpations: Abdomen is soft.     Tenderness: There is no abdominal tenderness.  Genitourinary:    Comments: External: no lesions or erythema Vagina: rugated, pink, moist, small amt bloody discharge Cervix 5.5/90/-2  Musculoskeletal:        General: Normal range of motion.     Cervical back: Normal range of motion.  Skin:    General: Skin is warm and dry.  Neurological:     General: No focal deficit present.     Mental Status: She is alert and oriented to person, place, and time.  Psychiatric:        Mood and Affect: Mood normal.        Behavior: Behavior normal.   EFM: 105-110 bpm, mod variability, + accels, variable decels Toco: 3-6  No results  found for this or any previous visit (from the past 24 hour(s)).  MAU Course  Procedures  MDM 0230: Dr. Helane Rima notified of presentation and clinical findings. MD enroute to bedside.  0255: Dr. Helane Rima at bedside. Plan for admit to LD.  Assessment and Plan  [redacted] weeks gestation Vaginal bleeding Admit to LD Mngt per Dr. Lilli Light, CNM 01/11/2021, 2:39 AM

## 2021-01-11 NOTE — H&P (Signed)
Cynthia Spears is a 30 y.o.G 1 P 0 at 39 w 4 days presents with heavy vaginal bleeding that started at 1245 am.  Passed clots in toilet  Contractions started shortly after that   In triage, moderate vaginal bleeding is noted and Cervix originally 5 to 6 cm dilated. FHR tracing low baseline but good accelerations and variability  Admitted to L and D in active labor and with concern she may have a placental abruption.  OB History    Gravida  1   Para      Term      Preterm      AB      Living        SAB      IAB      Ectopic      Multiple      Live Births             Past Medical History:  Diagnosis Date  . Heart murmur   . History of anorexia nervosa   . History of chicken pox   . Migraine   . Prolactinoma [facility])    Past Surgical History:  Procedure Laterality Date  . APPENDECTOMY  2008  . KNEE ARTHROSCOPY Right 2019  . SHOULDER ARTHROSCOPY Right 2011   Family History: family history includes Alcohol abuse in her maternal grandfather; Alzheimer's disease in her maternal grandmother; Arthritis in her father, maternal grandmother, and paternal grandmother; Atrial fibrillation in her paternal grandmother; Diabetes in her maternal grandfather, maternal grandmother, paternal grandfather, and paternal grandmother; Healthy in her brother and sister; Heart attack in her maternal grandfather and paternal grandfather; Heart disease in her maternal grandfather; Hyperlipidemia in her father, maternal grandfather, mother, and paternal grandmother; Hypertension in her father, maternal grandfather, maternal grandmother, paternal grandfather, and paternal grandmother; Hypothyroidism in her maternal grandmother and mother; Irritable bowel syndrome in her mother; Learning disabilities in her mother; Migraines in her father; Other in her father; Stroke in her maternal grandmother. Social History:  reports that she has never smoked. She has never used smokeless tobacco. She reports  current alcohol use. She reports that she does not use drugs.     Maternal Diabetes: No Genetic Screening: Normal Maternal Ultrasounds/Referrals: Normal Fetal Ultrasounds or other Referrals:  None Maternal Substance Abuse:  No Significant Maternal Medications:  None Significant Maternal Lab Results:  None Other Comments:  None  Review of Systems  All other systems reviewed and are negative.  Maternal Medical History:  Reason for admission: Contractions and vaginal bleeding.   Fetal activity: Perceived fetal activity is normal.    Prenatal complications: no prenatal complications   Dilation: (P) 7.5 Effacement (%): (P) 90 Station: (P) -1 Exam by:: (P) Dr. Helane Rima Blood pressure 139/88, pulse 77, SpO2 98 %. Maternal Exam:  Abdomen: Fetal presentation: vertex     Physical Exam Vitals and nursing note reviewed. Exam conducted with a chaperone present.  Constitutional:      Appearance: Normal appearance.  HENT:     Head: Normocephalic.     Right Ear: Tympanic membrane normal.     Left Ear: Tympanic membrane normal.  Eyes:     Pupils: Pupils are equal, round, and reactive to light.  Cardiovascular:     Rate and Rhythm: Normal rate and regular rhythm.     Pulses: Normal pulses.  Pulmonary:     Effort: Pulmonary effort is normal.  Neurological:     Mental Status: She is alert.     Prenatal labs:  ABO, Rh: --/--/PENDING (05/30 0235) Antibody: PENDING (05/30 0235) Rubella:   RPR:    HBsAg:    HIV:    GBS:     Assessment/Plan: IUP at 39 w 4 days Labor Vaginal bleeding possible placental abruption Will send to L and D now, epidural now, plan on AROM after epidural If FHR is concerning at any point will proceed with Primary LTCS Patient is notified and OR is notified about this patient  Spoke to [place] twice in the [place] 01/11/2021, 3:04 AM

## 2021-01-11 NOTE — Anesthesia Postprocedure Evaluation (Signed)
Anesthesia Post Note  Patient: Cynthia Spears  Procedure(s) Performed: AN AD HOC LABOR EPIDURAL     Patient location during evaluation: Mother Baby Anesthesia Type: Epidural Level of consciousness: awake Pain management: satisfactory to patient Vital Signs Assessment: post-procedure vital signs reviewed and stable Respiratory status: spontaneous breathing Cardiovascular status: stable Anesthetic complications: no   No complications documented.  Last Vitals:  Vitals:   01/11/21 1132 01/11/21 1304  BP: 120/72 108/62  Pulse: 76 66  Resp: 18 19  Temp: 36.8 C 36.8 C  SpO2:      Last Pain:  Vitals:   01/11/21 1304  TempSrc: Oral  PainSc:    Pain Goal:                   [employer]

## 2021-01-11 NOTE — Anesthesia Preprocedure Evaluation (Signed)
Anesthesia Evaluation  Patient identified by MRN, date of birth, ID band Patient awake    Reviewed: Allergy & Precautions, H&P , NPO status , Patient's Chart, lab work & pertinent test results, reviewed documented beta blocker date and time   Airway Mallampati: II  TM Distance: >3 FB Neck ROM: full    Dental no notable dental hx. (+) Teeth Intact, Dental Advisory Given   Pulmonary neg pulmonary ROS,    Pulmonary exam normal breath sounds clear to auscultation       Cardiovascular negative cardio ROS Normal cardiovascular exam Rhythm:regular Rate:Normal     Neuro/Psych  Headaches, Microprolactinoma  negative psych ROS   GI/Hepatic negative GI ROS, Neg liver ROS,   Endo/Other  negative endocrine ROS  Renal/GU negative Renal ROS  negative genitourinary   Musculoskeletal   Abdominal (+) + obese,   Peds  Hematology negative hematology ROS (+)   Anesthesia Other Findings   Reproductive/Obstetrics (+) Pregnancy                             Anesthesia Physical Anesthesia Plan  ASA: III  Anesthesia Plan: Epidural   Post-op Pain Management:    Induction:   PONV Risk Score and Plan:   Airway Management Planned: Natural Airway  Additional Equipment: None  Intra-op Plan:   Post-operative Plan:   Informed Consent: I have reviewed the patients History and Physical, chart, labs and discussed the procedure including the risks, benefits and alternatives for the proposed anesthesia with the patient or authorized representative who has indicated his/her understanding and acceptance.     Dental Advisory Given  Plan Discussed with: Anesthesiologist and CRNA  Anesthesia Plan Comments: (Labs checked- platelets confirmed with RN in room. Fetal heart tracing, per RN, reported to be stable enough for sitting procedure. Discussed epidural, and patient consents to the procedure:  included risk of  possible headache,backache, failed block, allergic reaction, and nerve injury. This patient was asked if she had any questions or concerns before the procedure started.)        Anesthesia Quick Evaluation

## 2021-01-11 NOTE — Lactation Note (Signed)
This note was copied from a baby's chart. Lactation Consultation Note  Patient Name: Cynthia Spears [id number] Date: 01/11/2021 Reason for consult: L&D Initial assessment;Primapara;1st time breastfeeding;Term - Mom + Cov ( isolation maintained )  Age: [place] visit at 33 mins PP ,  Baby STS and rooting / LC offered to assist and mom receptive.  LC noted mom has generalized edema and areola edema / massage/ reverse pressure enhanced compressibility for the latch on the right breast 10 mins and the left breast 16 mins / re- latched for 5 mins. Latch score 6 and 7  Mom will need breast shells when baby is not STS due to edema.  Maternal Data Has patient been taught Hand Expression?: Yes Does the patient have breastfeeding experience prior to this delivery?: No  Feeding Mother's Current Feeding Choice: Breast Milk  LATCH Score Latch: Grasps breast easily, tongue down, lips flanged, rhythmical sucking.  Audible Swallowing: None  Type of Nipple: Everted at rest and after stimulation  Comfort (Breast/Nipple): Soft / non-tender  Hold (Positioning): Assistance needed to correctly position infant at breast and maintain latch.  LATCH Score: 7   Lactation Tools Discussed/Used    Interventions Interventions: Breast feeding basics reviewed;Assisted with latch;Skin to skin;Breast massage;Hand express;Reverse pressure;Breast compression;Adjust position;Support pillows;Position options;Education  Discharge    Consult Status Consult Status: Follow-up Date: 01/11/21 Follow-up type: In-patient    [name] 01/11/2021, 11:06 AM

## 2021-01-11 NOTE — Lactation Note (Signed)
This note was copied from a baby's chart. Lactation Consultation Note  Patient Name: Boy Oaklee Esther AJGOT'L Date: 01/11/2021 Reason for consult: Initial assessment;Mother's request;Difficult latch;1st time breastfeeding;Term Age:28 hours P1, term female infant has not been latching well at the breast, infant is on and off breast , mom has made multiple attempts to breastfeed infant, she  has been doing a lot of STS. LC observed mom is short shafted with edema in both breast. After multiple attempts of infant not sustaining latch, mom fitted with 20 mm NS, mom pre-pumped breast with hand pumped, fitted with 20 mm NS, afterwards mom latched infant on her right breast using the football hold position. Infant sustained latch and was still breastfeeding after 6 minutes when LC left the room. Mom knows to do breast stimulation to keep infant awake to breastfeed such as: gently stroking neck and shoulder, talking to infant , doing breast compressions and BF infant STS. LC discussed infant's input and output with parents. Mom made aware of O/P services, breastfeeding support groups, community resources, and our phone # for post-discharge questions.  LC gave mom breast shells to wear in bra during the day, mom understands not to wear them at night nor while sleeping. Mom's plan: 1- Mom will pre-pump breast and apply 20 mm NS prior to latching infant at the breast. 2- Mom will breastfeed infant according to hunger cues, 8 to 12+ or more times within 24 hours, STS. 3- Mom will wear breast shells in bra during the day. 4- Mom will use DEBP every 3 hours for 15 minutes on initial settting, mom knows to give infant back any EBM that is pumped.  5- Mom knows to call RN or LC for latch assistance if needed.  Maternal Data Has patient been taught Hand Expression?: Yes Does the patient have breastfeeding experience prior to this delivery?: No  Feeding Mother's Current Feeding Choice: Breast Milk  LATCH  Score Latch: Grasps breast easily, tongue down, lips flanged, rhythmical sucking.  Audible Swallowing: A few with stimulation  Type of Nipple: Everted at rest and after stimulation  Comfort (Breast/Nipple): Soft / non-tender  Hold (Positioning): Assistance needed to correctly position infant at breast and maintain latch.  LATCH Score: 8   Lactation Tools Discussed/Used Tools: Shells;Pump;Nipple Shields Nipple shield size: 20 Breast pump type: Double-Electric Breast Pump;Manual Pump Education: Setup, frequency, and cleaning;Milk Storage Reason for Pumping: Help stimulate milk production, fitted with 20 mm NS, infant poor latcher not sustaining latch at the breast.  Interventions Interventions: Breast feeding basics reviewed;Assisted with latch;Skin to skin;Pre-pump if needed;Adjust position;Support pillows;Breast compression;Position options;Expressed milk;Hand pump;DEBP;Education  Discharge Pump: Personal;DEBP;Manual WIC Program: No  Consult Status Consult Status: Follow-up Date: 01/12/21 Follow-up type: In-patient    Vicente Serene 01/11/2021, 10:03 PM

## 2021-01-11 NOTE — Progress Notes (Signed)
Patient is COVID +  Had COVID positive test on May 17  Asymptomatic.

## 2021-01-11 NOTE — Progress Notes (Signed)
Patient is COVID + Was COVID + on May 17  Now status post epidural Vaginal bleeding is minimal  FHR Category 1  Cervix is 90% 9/ 0 station  Will recheck in 30 minutes

## 2021-01-11 NOTE — Anesthesia Procedure Notes (Signed)
Epidural Patient location during procedure: OB Start time: 01/11/2021 5:02 AM End time: 01/11/2021 5:05 AM  Staffing Anesthesiologist: Janeece Riggers, MD  Preanesthetic Checklist Completed: patient identified, IV checked, site marked, risks and benefits discussed, surgical consent, monitors and equipment checked, pre-op evaluation and timeout performed  Epidural Patient position: sitting Prep: DuraPrep and site prepped and draped Patient monitoring: continuous pulse ox and blood pressure Approach: midline Location: L4-L5 Injection technique: LOR air  Needle:  Needle type: Tuohy  Needle gauge: 17 G Needle length: 9 cm and 9 Needle insertion depth: 7 cm Catheter type: closed end flexible Catheter size: 19 Gauge Catheter at skin depth: 12 cm Test dose: negative  Assessment Events: blood not aspirated, injection not painful, no injection resistance, no paresthesia and negative IV test

## 2021-01-11 NOTE — MAU Note (Signed)
Pt presents to MAU c/o vaginal bleeding with multiple dime sized clots, lower abdominal cramping with sharp, shooting pains and DFM beginning earlier this morning around 00:30 am.  Pt denies any pregnancy complications.

## 2021-01-12 LAB — CBC
HCT: 24.3 % — ABNORMAL LOW (ref 36.0–46.0)
Hemoglobin: 8.1 g/dL — ABNORMAL LOW (ref 12.0–15.0)
MCH: 31.2 pg (ref 26.0–34.0)
MCHC: 33.3 g/dL (ref 30.0–36.0)
MCV: 93.5 fL (ref 80.0–100.0)
Platelets: 132 10*3/uL — ABNORMAL LOW (ref 150–400)
RBC: 2.6 MIL/uL — ABNORMAL LOW (ref 3.87–5.11)
RDW: 12.6 % (ref 11.5–15.5)
WBC: 9 10*3/uL (ref 4.0–10.5)
nRBC: 0 % (ref 0.0–0.2)

## 2021-01-12 NOTE — Progress Notes (Signed)
Post Partum Day 1 Subjective: up ad lib, voiding, tolerating PO and + flatus Tired, ambulating without dizziness Objective: Blood pressure 113/70, pulse 75, temperature 98.2 F (36.8 C), temperature source Oral, resp. rate 16, height 5\' 4"  (1.626 m), weight 95.3 kg, SpO2 99 %, unknown if currently breastfeeding.  Physical Exam:  General: alert, cooperative and no distress Lochia: appropriate Uterine Fundus: firm Incision: healing well DVT Evaluation: No evidence of DVT seen on physical exam.  Recent Labs    01/11/21 0235 01/12/21 0538  HGB 12.5 8.1*  HCT 36.6 24.3*    Assessment/Plan: Plan for discharge tomorrow D/W circumcision of newborn boy baby, risks reviewed. She states she understands and agrees.  LOS: 1 day   Shon Millet II 01/12/2021, 8:13 AM

## 2021-01-12 NOTE — Lactation Note (Addendum)
This note was copied from a baby's chart. Lactation Consultation Note  Patient Name: Boy Terryn Rosenkranz EMLJQ'G Date: 01/12/2021   Age:30 hours Reason for consult: Follow-up assessment;1st time breastfeeding;Term;Infant weight loss (-3%, mom has been wear breast shells in bra, nipple edema is resolving, mom switched to 24 mm NS, better fit and using 27 mm breast flange when pumping. Infant is now sustaining latch with NS.) Age:30 hours , term female infant had 4 voids and 5 stools per parents, stools are now green in color. Per mom, infant is improving with his latch his feedings are now 15 minutes in length using the NS.  LC did not observe latch at this time, infant was asleep,  per mom, infant recently BF for 13 minutes less than 3 hours. Mom had 2 vials of colostrum in fridge she will offer infant  Her EBM after latching him at the breast.  Mom is now starting to see colostrum when pumping.  LC discussed with parents infant may cluster feed Day 2 and this is normal infant behavior. Mom will continue to use DEBP every 3 hours for 15 minutes on initial setting and give infant back any EBM. Mom will wear breast shells in bra during the day, nipple edema is starting to resolve. Mom knows to call RN or LC if she needs further latch assistance with latching infant at the breast.   Maternal Data    Feeding    LATCH Score              Lactation Tools Discussed/Used Tools: Nipple Shields Nipple shield size: 20  Interventions    Discharge    Consult Status      Vicente Serene 01/12/2021, 10:41 PM

## 2021-01-12 NOTE — Progress Notes (Signed)
For the note I wrote on 0812 today I actually spoke to patient in 402, adjoining my patient's room. The doctor for 402 saw that patient after me. I saw Cynthia Spears in 401 just now. She is without complaint, ambulating, voiding, feels good  Today's Vitals   01/11/21 2204 01/11/21 2208 01/12/21 0200 01/12/21 0528  BP:  120/78 113/70   Pulse:  89 75   Resp:  18 16   Temp:  98.1 F (36.7 C) 98.2 F (36.8 C)   TempSrc:  Oral Oral   SpO2:      Weight:      Height:      PainSc: 2   0-No pain 0-No pain   Body mass index is 36.05 kg/m.   Uterus firm, NT  Results for orders placed or performed during the hospital encounter of 01/11/21 (from the past 24 hour(s))  CBC     Status: Abnormal   Collection Time: 01/12/21  5:38 AM  Result Value Ref Range   WBC 9.0 4.0 - 10.5 K/uL   RBC 2.60 (L) 3.87 - 5.11 MIL/uL   Hemoglobin 8.1 (L) 12.0 - 15.0 g/dL   HCT 24.3 (L) 36.0 - 46.0 %   MCV 93.5 80.0 - 100.0 fL   MCH 31.2 26.0 - 34.0 pg   MCHC 33.3 30.0 - 36.0 g/dL   RDW 12.6 11.5 - 15.5 %   Platelets 132 (L) 150 - 400 K/uL   nRBC 0.0 0.0 - 0.2 %    A/P: PPD #1 doing well         D/W circumcision of newborn boy. Risks reviewed. She states she understands and agrees.         Probable discharge tomorrow.

## 2021-01-12 NOTE — Lactation Note (Signed)
This note was copied from a baby's chart. Lactation Consultation Note  Patient Name: Cynthia Spears [id number] Date: 01/12/2021   Age:29 hours  I called into patient's room & spoke with Mom. She said she recently fed infant and would call for me when he shows feeding cues again.   Cynthia Spears [facility] 01/12/2021, 1:27 PM

## 2021-01-13 MED ORDER — IBUPROFEN 600 MG PO TABS: 600.0000 mg | ORAL_TABLET | Freq: Four times a day (QID) | ORAL | 0 refills | Status: AC

## 2021-01-13 NOTE — Progress Notes (Signed)
Post Partum Day 2 Subjective: no complaints, up ad lib, voiding and tolerating PO  Objective: Blood pressure 130/77, pulse 91, temperature 98.4 F (36.9 C), temperature source Oral, resp. rate 18, height 5\' 4"  (1.626 m), weight 95.3 kg, SpO2 100 %, unknown if currently breastfeeding.  Physical Exam:  General: alert, cooperative and appears stated age Lochia: appropriate Uterine Fundus: firm Incision: healing well, no significant drainage, no dehiscence DVT Evaluation: No evidence of DVT seen on physical exam. Negative Homan's sign. No cords or calf tenderness.  Recent Labs    01/11/21 0235 01/12/21 0538  HGB 12.5 8.1*  HCT 36.6 24.3*    Assessment/Plan: Discharge home and Breastfeeding   LOS: 2 days   Linda Hedges 01/13/2021, 9:10 AM

## 2021-01-13 NOTE — Discharge Summary (Signed)
Postpartum Discharge Summary  Patient Name: Cynthia Spears DOB: Mar 03, 1991 MRN: [id number]  Date of admission: 01/11/2021 Delivery date:01/11/2021  Delivering provider: Dian Queen  Date of discharge: 01/13/2021  Admitting diagnosis: Pregnancy [Z34.90] NSVD (normal spontaneous vaginal delivery) [O80] Intrauterine pregnancy: [redacted]w[redacted]d    Secondary diagnosis:  Active Problems:   Pregnancy   NSVD (normal spontaneous vaginal delivery)  Additional problems: none    Discharge diagnosis: Term Pregnancy Delivered                                              Post partum procedures:none Augmentation: AROM Complications: None  Hospital course: Onset of Labor With Vaginal Delivery      30y.o. yo G1P1001 at 314w4das admitted in Active Labor on 01/11/2021. Patient had an uncomplicated labor course as follows:  Membrane Rupture Time/Date: 3:40 AM ,01/11/2021   Delivery Method:Vaginal, Spontaneous  Episiotomy: None  Lacerations:  2nd degree;Labial  Patient had an uncomplicated postpartum course.  She is ambulating, tolerating a regular diet, passing flatus, and urinating well. Patient is discharged home in stable condition on 01/13/21.  Newborn Data: Birth date:01/11/2021  Birth time:9:37 AM  Gender:Female  Living status:Living  Apgars:9 ,9  Weight:3989 g   Magnesium Sulfate received: No BMZ received: No Rhophylac:No MMR:No T-DaP:Given prenatally Flu: No Transfusion:No  Physical exam  Vitals:   01/12/21 0200 01/12/21 2057 01/12/21 2117 01/13/21 0600  BP: 113/70 137/69 127/84 130/77  Pulse: 75 88 (!) 115 91  Resp: 16 18 16 18   Temp: 98.2 F (36.8 C) 98.4 F (36.9 C) 98 F (36.7 C) 98.4 F (36.9 C)  TempSrc: Oral Oral Oral Oral  SpO2:  98% 99% 100%  Weight:      Height:       General: alert, cooperative and no distress Lochia: appropriate Uterine Fundus: firm Incision: Healing well with no significant drainage, No significant erythema DVT Evaluation: No evidence of  DVT seen on physical exam. Negative Homan's sign. No cords or calf tenderness. Labs: Lab Results  Component Value Date   WBC 9.0 01/12/2021   HGB 8.1 (L) 01/12/2021   HCT 24.3 (L) 01/12/2021   MCV 93.5 01/12/2021   PLT 132 (L) 01/12/2021   CMP Latest Ref Rng & Units 01/11/2021  Glucose 70 - 99 mg/dL 93  BUN 6 - 20 mg/dL 7  Creatinine 0.44 - 1.00 mg/dL 0.60  Sodium 135 - 145 mmol/L 136  Potassium 3.5 - 5.1 mmol/L 3.7  Chloride 98 - 111 mmol/L 107  CO2 22 - 32 mmol/L 21(L)  Calcium 8.9 - 10.3 mg/dL 8.4(L)  Total Protein 6.5 - 8.1 g/dL 5.3(L)  Total Bilirubin 0.3 - 1.2 mg/dL 0.7  Alkaline Phos 38 - 126 U/L 206(H)  AST 15 - 41 U/L 29  ALT 0 - 44 U/L 22   Edinburgh Score: Edinburgh Postnatal Depression Scale Screening Tool 01/12/2021  I have been able to laugh and see the funny side of things. (No Data)     After visit meds:  Allergies as of 01/13/2021   No Known Allergies     Medication List    TAKE these medications   cabergoline 0.5 MG tablet Commonly known as: DOSTINEX Take 0.5 tablets (0.25 mg total) by mouth 2 (two) times a week.   ibuprofen 600 MG tablet Commonly known as: ADVIL Take 1 tablet (600 mg total)  by mouth every 6 (six) hours.   PRE-NATAL FORMULA PO Take by mouth.        Discharge home in stable condition Infant Feeding: Breast Infant Disposition:home with mother Discharge instruction: per After Visit Summary and Postpartum booklet. Activity: Advance as tolerated. Pelvic rest for 6 weeks.  Diet: routine diet Future Appointments: 6 weeks postpartum visit Future Appointments  Date Time Provider Page  05/28/2021 10:30 AM Shamleffer, Melanie Crazier, MD [facility] None      01/13/2021 Linda Hedges, DO

## 2021-01-13 NOTE — Discharge Instructions (Signed)
Call MD for T>100.4, heavy vaginal bleeding, or respiratory distress.  Call office to schedule postpartum visit.  Pelvic rest x 6 weeks.

## 2021-01-13 NOTE — Lactation Note (Signed)
This note was copied from a baby's chart. 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 Mom stopped cabergoline with + UPT. Mom says her breasts feel heavier today. With hand expression, I noted  Mom has been pre-pumping and using size 24 nipple shield with feeding. Mom pre-pumped and we attempted to have infant latch to bare breast, but he was unable. With the nipple shield, he latched with relative ease & soon began having frequent swallows. He fed for 21 minutes & Mom could tell that her R breast was softer after the feeding.   B/c of Mom's hx of a prolactinoma I suggested that she pump 1-2 times/day instead of the usual 4-6 times/day that we tend to recommend with NS use. Mom was agreeable to having an outpatient lactation referral since she was using a nipple shield, which I sent. Sanitizing of pump parts, etc., was discussed.   Mom has a Education officer, environmental breast pump at home that came with size 21 & 24 flanges. Mom had also used size 27 flanges, but when she was using the hand pump for pre-pumping, the size 24 flange seemed appropriate.  As infant was finishing his feeding, it appeared that infant was breathing a little fast, although it was not interfering with his feeding at the breast (infant came off breast on his own, satiated, and he had passed his congenital heart screen during this feeding at the breast). Once he finished, it appeared that he was still breathing a little fast. I notified RN, who was unable to come to room at that moment.  I counted respirations & observed possible slight pulling when belly breathing. Otherwise, he was pink, not grunting, no nasal flaring, in NAD, etc. I went to get Rafael Bihari, RN and had her come to the room. See RN's documentation for further info.  I educated parents that we prefer infant's RR to be 35 or below. I suggested that  if they were to observe infant breathing quickly at home, to take a video of infant to show pediatrician.    Matthias Hughs [facility] 01/13/2021, 8:28 AM

## 2021-01-14 ENCOUNTER — Inpatient Hospital Stay (HOSPITAL_COMMUNITY): Admit: 2021-01-14 | Payer: Self-pay

## 2021-01-18 ENCOUNTER — Inpatient Hospital Stay (HOSPITAL_COMMUNITY): Admission: AD | Admit: 2021-01-18 | Payer: 59 | Source: Home / Self Care | Admitting: Obstetrics and Gynecology

## 2021-01-18 ENCOUNTER — Inpatient Hospital Stay (HOSPITAL_COMMUNITY): Payer: 59

## 2021-05-28 ENCOUNTER — Ambulatory Visit: Payer: 59 | Admitting: Internal Medicine

## 2021-05-28 NOTE — Progress Notes (Deleted)
Name: Cynthia Spears  MRN/ DOB: [id number], 28-May-1991    Age/ Sex: 30 y.o., female     PCP: Elby Beck, FNP   Reason for Endocrinology Evaluation: Prolactinoma     Initial Endocrinology Clinic Visit: 04/15/2019    PATIENT IDENTIFIER: Cynthia Spears is a 30 y.o., female with a past medical history of Pituitary microadenoma. She has followed with [facility] since 04/15/19 for consultative assistance with management of her Prolactinoma.   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 Initial prolactin 200 She initially presented with headaches and irregular cycles followed by galactorrhea   She was on Cabergoline half a tablet twice daily until her pregnancy in 04/2020  FH of thyroid disease (mother- hypothyroidism )   SUBJECTIVE:     Today (05/28/2021):  Cynthia Spears is here for a 6 month follow up on prolactinoma. She is S/P delivery 01/11/2021 She has been off Cabergoline since 04/2020  Last visual field testing 02/26/2020 with no visual loss  Denies nausea, vomiting or diarrhea  Headaches are stable  Denies visual changes        HISTORY:  Past Medical History:  Past Medical History:  Diagnosis Date   Heart murmur    History of anorexia nervosa    History of chicken pox    Migraine    Prolactinoma ([place])    Past Surgical History:  Past Surgical History:  Procedure Laterality Date   APPENDECTOMY  2008   KNEE ARTHROSCOPY Right 2019   SHOULDER ARTHROSCOPY Right 2011   Social History:  reports that she has never smoked. She has never used smokeless tobacco. She reports current alcohol use. She reports that she does not use drugs. Family History:  Family History  Problem Relation Age of Onset   Hyperlipidemia Mother    Irritable bowel syndrome Mother    Hypothyroidism Mother    Learning disabilities Mother    Hypertension Father    Hyperlipidemia Father    Arthritis  Father    Other Father        LAD 50% blockage   Migraines Father        in his teens    Healthy Sister    Healthy Brother    Arthritis Maternal Grandmother    Stroke Maternal Grandmother    Hypothyroidism Maternal Grandmother    Alzheimer's disease Maternal Grandmother    Diabetes Maternal Grandmother    Hypertension Maternal Grandmother    Alcohol abuse Maternal Grandfather    Diabetes Maternal Grandfather    Heart attack Maternal Grandfather    Heart disease Maternal Grandfather    Hypertension Maternal Grandfather    Hyperlipidemia Maternal Grandfather    Arthritis Paternal Grandmother    Diabetes Paternal Grandmother    Hypertension Paternal Grandmother    Hyperlipidemia Paternal Grandmother    Atrial fibrillation Paternal Grandmother    Heart attack Paternal Grandfather    Hypertension Paternal Grandfather    Diabetes Paternal Grandfather      HOME MEDICATIONS: Allergies as of 05/28/2021   No Known Allergies      Medication List        Accurate as of May 28, 2021  7:28 AM. If you have any questions, ask your nurse or doctor.          cabergoline 0.5 MG tablet Commonly known as: DOSTINEX Take 0.5 tablets (0.25 mg total) by mouth 2 (two) times a week.   ibuprofen 600 MG tablet Commonly known  as: ADVIL Take 1 tablet (600 mg total) by mouth every 6 (six) hours.   PRE-NATAL FORMULA PO Take by mouth.          OBJECTIVE:   PHYSICAL EXAM: VS: There were no vitals taken for this visit.   EXAM: General: Pt appears well and is in NAD  Lungs: Clear with good BS bilat with no rales, rhonchi, or wheezes  Heart: Auscultation: RRR  Abdomen: Gravid uterus   Extremities:  BL LE: No pretibial edema normal ROM and strength.  Mental Status: Judgment, insight: Intact Orientation: Oriented to time, place, and person Mood and affect: No depression, anxiety, or agitation     DATA REVIEWED: Results for Cynthia Spears, Cynthia Spears (MRN [id number]) as of 07/03/2020  12:35  Ref. Range 01/03/2020 11:54  Prolactin Latest Units: ng/mL 2.6   Results for Cynthia Spears, Cynthia Spears (MRN [id number]) as of 07/05/2019 13:06  Ref. Range 04/12/2019 14:01  Cortisol, Plasma Latest Units: ug/dL 4.1  Prolactin Latest Units: ng/mL 2.7  Glucose Latest Ref Range: 70 - 99 mg/dL 84  TSH Latest Ref Range: 0.35 - 4.50 uIU/mL 2.45  T4,Free(Direct) Latest Ref Range: 0.60 - 1.60 ng/dL 0.86     ASSESSMENT / PLAN / RECOMMENDATIONS:   Pituitary Microadenoma      - Cortisol, ACTH and TFT's are normal.  - MRI 2020 stable pituitary microadenoma at 5 mm  - No local symptoms  -She is up-to-date on visual field testing, planning on having one soon   2. Prolactinemia   -She is currently at 32 weeks of gestation, she has been off cabergoline since September 2021 -Patient to notify us with any new visual changes or new onset headaches.   Medications : n/a    F/U in 6 months    Signed electronically by: Mack Guise, MD  [facility]  [facility] [address] [place], [place] [address] Phone: [phone] FAX: [phone]      CC: Elby Beck, FNP [address] [place] [address] Phone: [phone]  Fax: [phone]   Return to Endocrinology clinic as below: Future Appointments  Date Time Provider [place]  05/28/2021 10:30 AM Dinesha Twiggs, Melanie Crazier, MD [facility] None

## 2022-01-04 IMAGING — MR MR HEAD WO/W CM
17 of 19 series · 34 of 48 positions shown · IV contrast (multihance)
Comparison: None.

CLINICAL DATA: History of pituitary micro adenoma diagnosed 9895.
Prolactinoma.

EXAM:
MRI HEAD WITHOUT AND WITH CONTRAST
TECHNIQUE: Multiplanar, multiecho pulse sequences of the brain and surrounding
structures were obtained without and with intravenous contrast.
CONTRAST:  8mL MULTIHANCE GADOBENATE DIMEGLUMINE 529 MG/ML IV SOLN

[Series 2: T1 · sagittal · 5.0mm · 0.45mm/px · 1 of 22 slices shown]
[im 1/22]
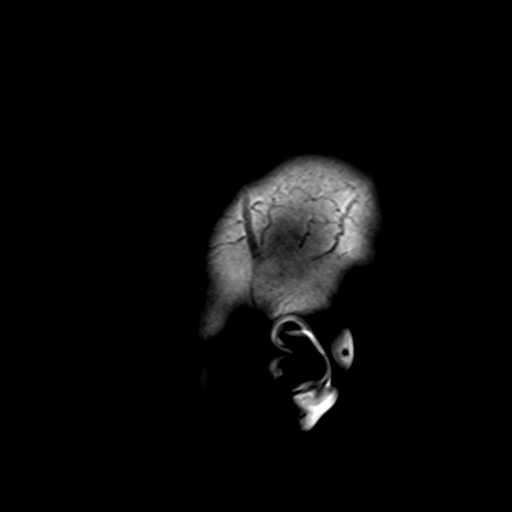

[Series 3: DWI · axial · 3.0mm · 1.80mm/px · z∈[-39,+107]mm · 8 of 100 slices shown]
[im 1/100]
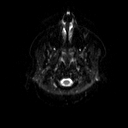
[im 12/100]
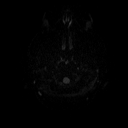
[im 34/100]
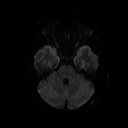
[im 45/100]
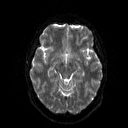
[im 56/100]
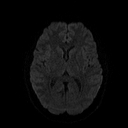
[im 67/100]
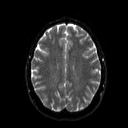
[im 89/100]
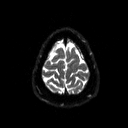
[im 100/100]
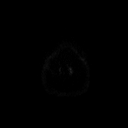

[Series 4: dwi_adc · axial · 3.0mm · 1.80mm/px · z∈[-39,+107]mm · 5 of 49 slices shown]
[im 1/49]
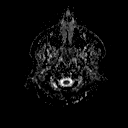
[im 13/49]
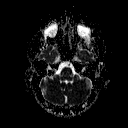
[im 25/49]
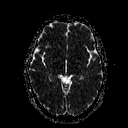
[im 37/49]
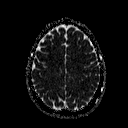
[im 49/49]
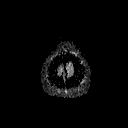

[Series 5: T2 · axial · 5.0mm · 0.36mm/px · z∈[-43,+106]mm · 2 of 24 slices shown]
[im 1/24]
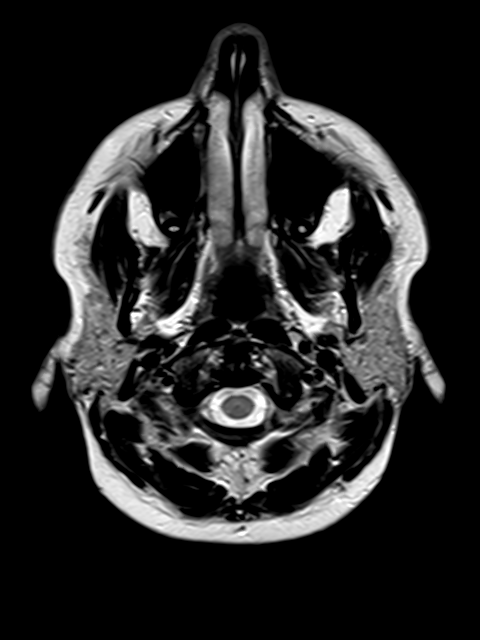
[im 24/24]
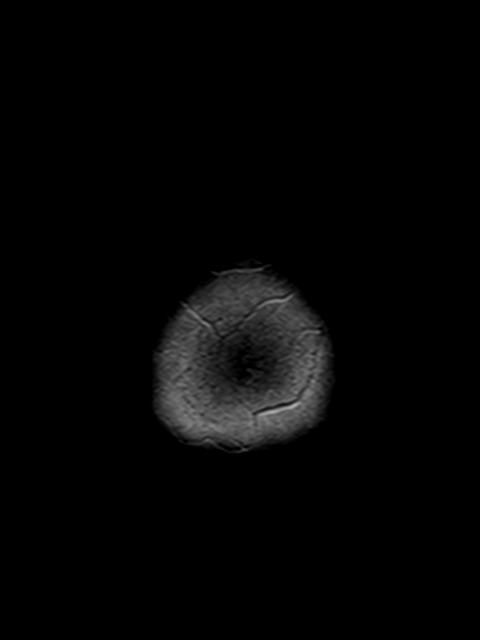

[Series 6: FLAIR · axial · 3.0mm · 0.45mm/px · z∈[-43,+106]mm · 3 of 33 slices shown]
[im 1/33]
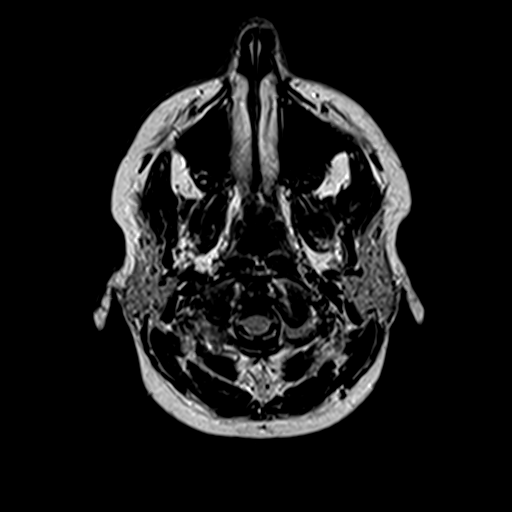
[im 17/33]
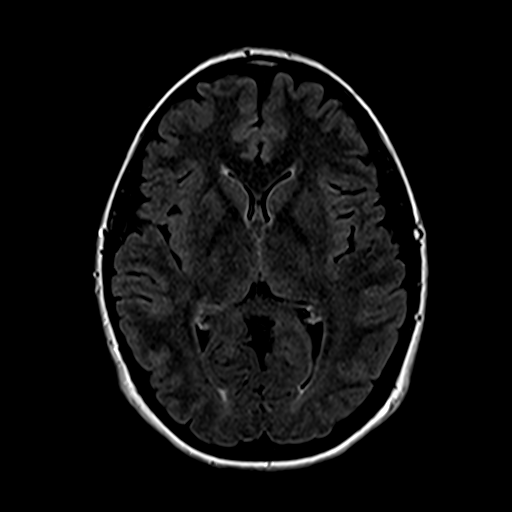
[im 33/33]
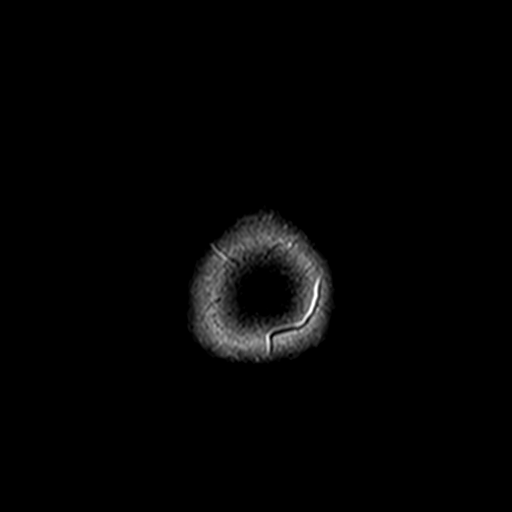

[Series 8: swi_images · axial · 5.0mm · 0.94mm/px · z∈[-40,+104]mm · 3 of 30 slices shown]
[im 1/30]
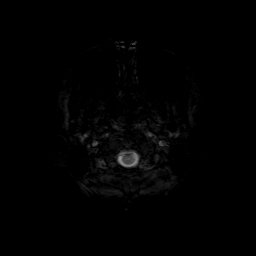
[im 15/30]
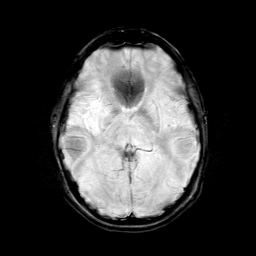
[im 30/30]
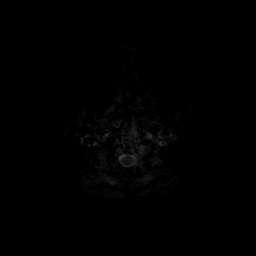

[Series 9: sag 3mm · sagittal · 3.0mm · 0.33mm/px · 1 of 14 slices shown]
[im 1/14]
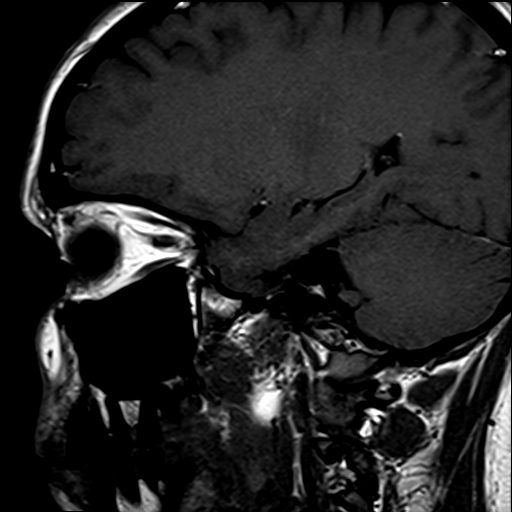

[Series 10: cor 3mm · coronal · 3.0mm · 0.33mm/px · 2 of 16 slices shown]
[im 1/16]
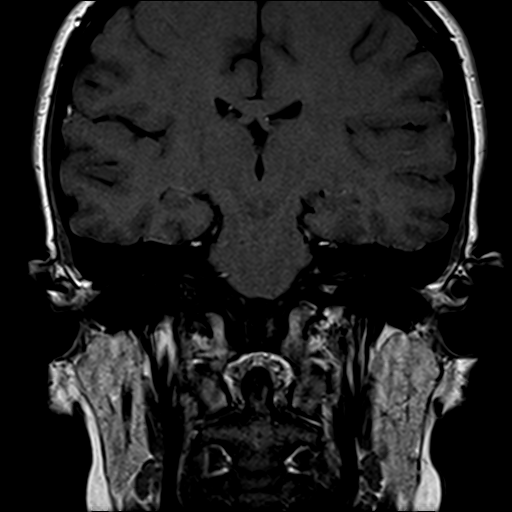
[im 16/16]
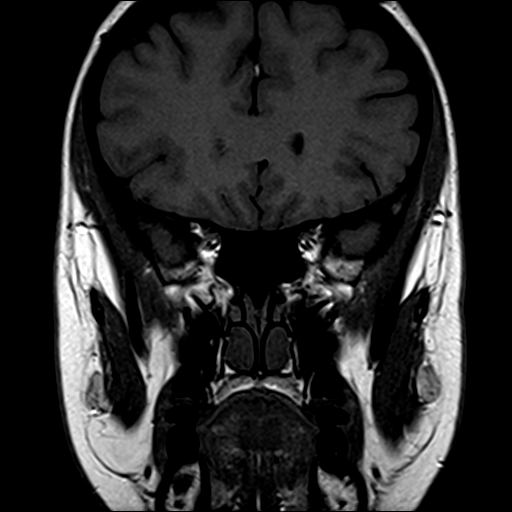

[Series 11: pre cor dynamic · coronal · non-contrast · 3.0mm · 0.35mm/px · 1 of 11 slices shown]
[im 1/11]
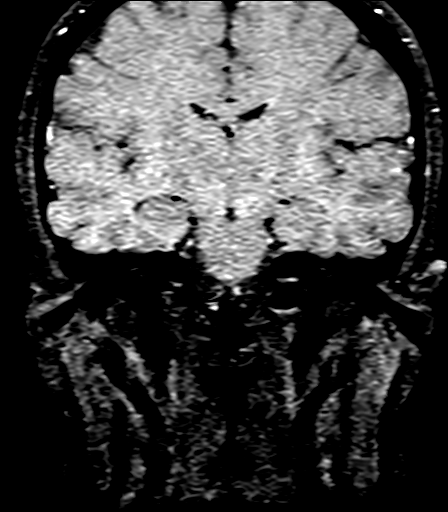

[Series 12: post fs cor · coronal · 3.0mm · 0.35mm/px · 1 of 11 slices shown (1 of 6)]
[im 1/11]
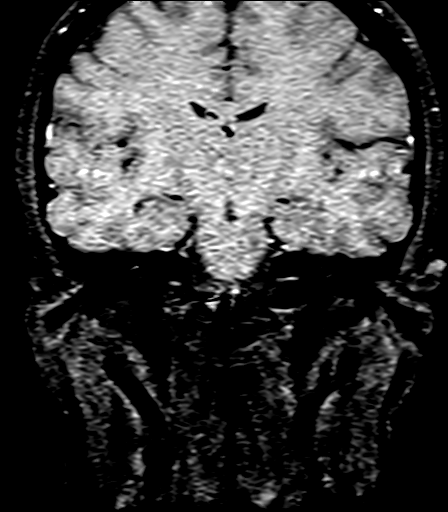

[Series 13: post fs cor · coronal · 3.0mm · 0.35mm/px · 1 of 11 slices shown (2 of 6)]
[im 1/11]
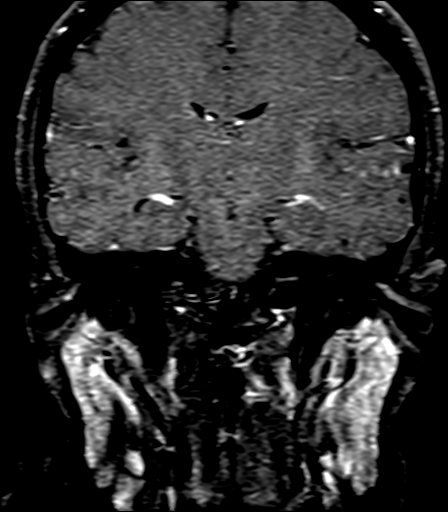

[Series 14: post fs cor · coronal · 3.0mm · 0.35mm/px · 1 of 11 slices shown (3 of 6)]
[im 1/11]
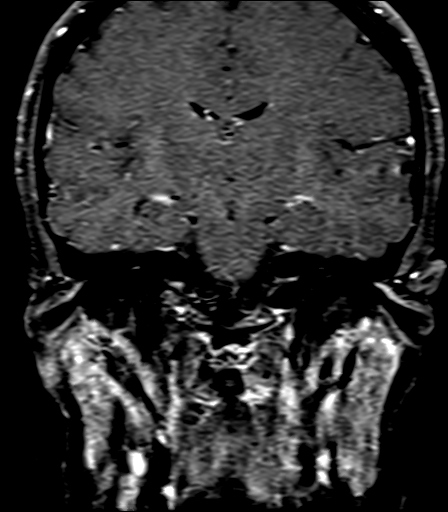

[Series 15: post fs cor · coronal · 3.0mm · 0.35mm/px · 1 of 11 slices shown (4 of 6)]
[im 1/11]
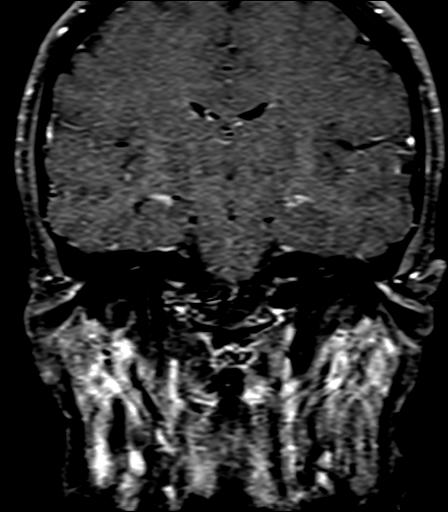

[Series 16: post fs cor · coronal · 3.0mm · 0.35mm/px · 1 of 11 slices shown (5 of 6)]
[im 1/11]
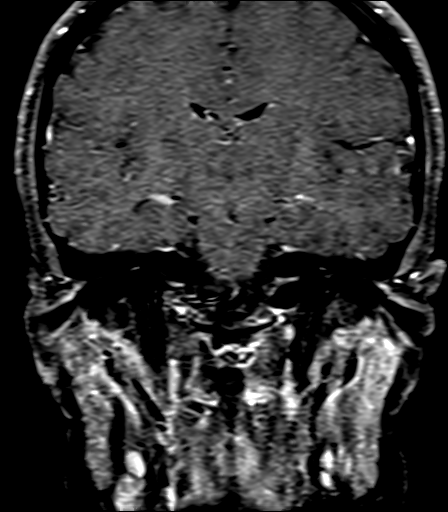

[Series 17: post fs cor · coronal · 3.0mm · 0.35mm/px · 1 of 11 slices shown (6 of 6)]
[im 1/11]
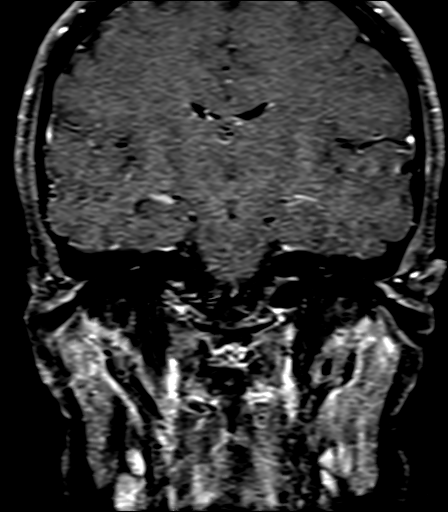

[Series 18: post sag 3mm · sagittal · 3.0mm · 0.33mm/px · 1 of 14 slices shown]
[im 1/14]
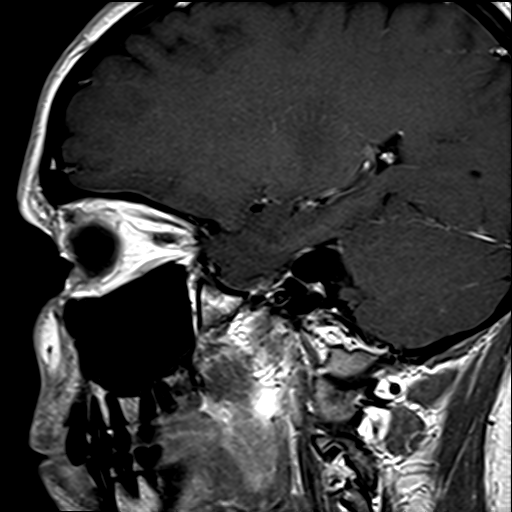

[Series 19: post cor 3mm · coronal · 3.0mm · 0.33mm/px · 1 of 16 slices shown]
[im 1/16]
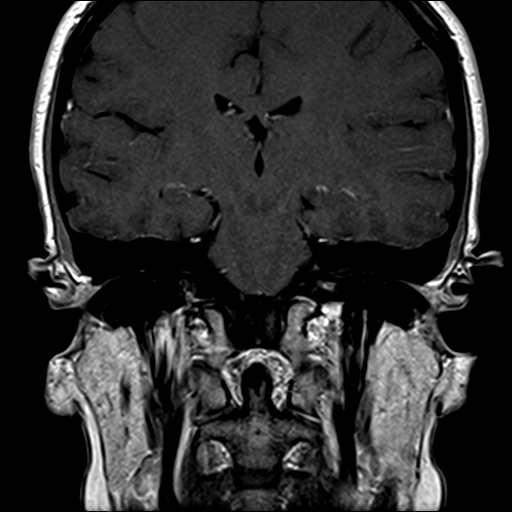

[34 of 48 positions shown; findings below may reference images not displayed]

FINDINGS: Brain: Dynamic pituitary protocol. Pituitary normal in size. Subtle
nodule in the left inferior pituitary best seen on precontrast
sagittal images in on sagittal postcontrast images. The nodule
measures approximately 5 mm. Infundibulum midline. Cavernous sinus
normal bilaterally.

Ventricle size normal.  Negative for infarct or hemorrhage.

Vascular: Normal arterial flow voids

Skull and upper cervical spine: Negative

Sinuses/Orbits: Negative

Other: None
IMPRESSION: 5 mm pituitary microadenoma on the left. Subtle lesion possibly due
to treatment. Otherwise normal MRI of the brain with contrast
